# Patient Record
Sex: Female | Born: 1975 | Race: White | Hispanic: No | Marital: Single
Health system: Northeastern US, Academic
[De-identification: ages and names within clinical notes are randomized; demographics above are authoritative.]

## PROBLEM LIST (undated history)

## (undated) DIAGNOSIS — R87619 Unspecified abnormal cytological findings in specimens from cervix uteri: Secondary | ICD-10-CM

## (undated) DIAGNOSIS — IMO0002 Reserved for concepts with insufficient information to code with codable children: Secondary | ICD-10-CM

## (undated) DIAGNOSIS — L930 Discoid lupus erythematosus: Secondary | ICD-10-CM

## (undated) DIAGNOSIS — F419 Anxiety disorder, unspecified: Secondary | ICD-10-CM

## (undated) HISTORY — DX: Discoid lupus erythematosus: L93.0

## (undated) HISTORY — PX: CHOLECYSTECTOMY: SHX55

## (undated) HISTORY — DX: Anxiety disorder, unspecified: F41.9

## (undated) HISTORY — PX: GYNECOLOGIC CRYOSURGERY: SHX857

## (undated) HISTORY — DX: Unspecified abnormal cytological findings in specimens from cervix uteri: R87.619

## (undated) HISTORY — DX: Reserved for concepts with insufficient information to code with codable children: IMO0002

## (undated) MED FILL — ZEPBOUND 10 MG/0.5 ML SUBCUTANEOUS SOLUTION: 10 10 mg/0.5 mL | SUBCUTANEOUS | 28 days supply | Qty: 2 | Fill #0 | Status: CP

---

## 1997-11-11 ENCOUNTER — Other Ambulatory Visit: Admission: RE | Admit: 1997-11-11 | Discharge: 1997-11-11 | Payer: Self-pay | Admitting: Obstetrics & Gynecology

## 1997-12-17 ENCOUNTER — Emergency Department (HOSPITAL_COMMUNITY): Admission: EM | Admit: 1997-12-17 | Discharge: 1997-12-17 | Payer: Self-pay | Admitting: Emergency Medicine

## 1999-02-08 ENCOUNTER — Other Ambulatory Visit: Admission: RE | Admit: 1999-02-08 | Discharge: 1999-02-08 | Payer: Self-pay | Admitting: Obstetrics & Gynecology

## 2000-02-29 ENCOUNTER — Other Ambulatory Visit: Admission: RE | Admit: 2000-02-29 | Discharge: 2000-02-29 | Payer: Self-pay | Admitting: Obstetrics & Gynecology

## 2001-05-23 ENCOUNTER — Other Ambulatory Visit: Admission: RE | Admit: 2001-05-23 | Discharge: 2001-05-23 | Payer: Self-pay | Admitting: Obstetrics & Gynecology

## 2002-09-24 ENCOUNTER — Other Ambulatory Visit: Admission: RE | Admit: 2002-09-24 | Discharge: 2002-09-24 | Payer: Self-pay | Admitting: Obstetrics & Gynecology

## 2003-11-05 ENCOUNTER — Other Ambulatory Visit: Admission: RE | Admit: 2003-11-05 | Discharge: 2003-11-05 | Payer: Self-pay | Admitting: Obstetrics & Gynecology

## 2004-11-15 ENCOUNTER — Other Ambulatory Visit: Admission: RE | Admit: 2004-11-15 | Discharge: 2004-11-15 | Payer: Self-pay | Admitting: Obstetrics & Gynecology

## 2004-11-16 ENCOUNTER — Other Ambulatory Visit: Admission: RE | Admit: 2004-11-16 | Discharge: 2004-11-16 | Payer: Self-pay | Admitting: Obstetrics & Gynecology

## 2005-09-05 ENCOUNTER — Encounter: Admission: RE | Admit: 2005-09-05 | Discharge: 2005-09-05 | Payer: Self-pay | Admitting: Gastroenterology

## 2007-05-22 ENCOUNTER — Emergency Department (HOSPITAL_COMMUNITY): Admission: EM | Admit: 2007-05-22 | Discharge: 2007-05-22 | Payer: Self-pay | Admitting: Emergency Medicine

## 2009-12-17 ENCOUNTER — Encounter: Admission: RE | Admit: 2009-12-17 | Discharge: 2009-12-17 | Payer: Self-pay | Admitting: Obstetrics & Gynecology

## 2010-02-21 NOTE — L&D Delivery Note (Signed)
Patient was C/C/+1 and pushed for 100 minutes with epidural.   NSVD  female infant, Apgars 8/9, weight 7#2.   The patient had a right labia minus and 1st degree perineal lacerations repaired with 3-0 vicryl. Fundus was firm. EBL was expected. Placenta was delivered intact. Vagina was clear.  Baby was vigorous to bedside.  Philip Aspen

## 2010-06-16 LAB — ABO/RH: RH Type: POSITIVE

## 2010-06-16 LAB — GC/CHLAMYDIA PROBE AMP, GENITAL
Chlamydia: NEGATIVE
Gonorrhea: NEGATIVE

## 2010-06-16 LAB — HIV ANTIBODY (ROUTINE TESTING W REFLEX): HIV: NONREACTIVE

## 2010-11-15 LAB — POCT CARDIAC MARKERS: Operator id: 294521

## 2010-11-15 LAB — POCT I-STAT, CHEM 8
BUN: 12
Chloride: 106
Creatinine, Ser: 1
Potassium: 3.7
Sodium: 138
TCO2: 23

## 2010-11-15 LAB — CBC
Hemoglobin: 12.9
MCHC: 34.6
MCV: 96.6
RDW: 12.2

## 2011-01-26 ENCOUNTER — Inpatient Hospital Stay (HOSPITAL_COMMUNITY): Admission: AD | Admit: 2011-01-26 | Payer: Self-pay | Source: Ambulatory Visit | Admitting: Obstetrics & Gynecology

## 2011-01-31 ENCOUNTER — Encounter (HOSPITAL_COMMUNITY): Payer: Self-pay | Admitting: *Deleted

## 2011-01-31 ENCOUNTER — Telehealth (HOSPITAL_COMMUNITY): Payer: Self-pay | Admitting: *Deleted

## 2011-01-31 NOTE — Telephone Encounter (Signed)
Preadmission screen  

## 2011-02-04 ENCOUNTER — Encounter (HOSPITAL_COMMUNITY): Payer: Self-pay | Admitting: Anesthesiology

## 2011-02-04 ENCOUNTER — Inpatient Hospital Stay (HOSPITAL_COMMUNITY)
Admission: RE | Admit: 2011-02-04 | Discharge: 2011-02-06 | DRG: 373 | Disposition: A | Discharge status: Home / Self Care | Payer: BC Managed Care – PPO | Source: Ambulatory Visit | Attending: Obstetrics & Gynecology | Admitting: Obstetrics & Gynecology

## 2011-02-04 ENCOUNTER — Encounter (HOSPITAL_COMMUNITY): Payer: Self-pay

## 2011-02-04 ENCOUNTER — Inpatient Hospital Stay (HOSPITAL_COMMUNITY): Payer: BC Managed Care – PPO | Admitting: Anesthesiology

## 2011-02-04 DIAGNOSIS — O480 Post-term pregnancy: Principal | ICD-10-CM | POA: Diagnosis present

## 2011-02-04 LAB — CBC
HCT: 33.1 % — ABNORMAL LOW (ref 36.0–46.0)
MCH: 31.1 pg (ref 26.0–34.0)
MCHC: 33.2 g/dL (ref 30.0–36.0)
MCV: 93.5 fL (ref 78.0–100.0)
Platelets: 232 10*3/uL (ref 150–400)
RDW: 13.6 % (ref 11.5–15.5)

## 2011-02-04 MED ORDER — PHENYLEPHRINE 40 MCG/ML (10ML) SYRINGE FOR IV PUSH (FOR BLOOD PRESSURE SUPPORT)
80.0000 ug | PREFILLED_SYRINGE | INTRAVENOUS | Status: DC | PRN
  Filled 2011-02-04: qty 5

## 2011-02-04 MED ORDER — ACETAMINOPHEN 325 MG PO TABS: 650.0000 mg | ORAL_TABLET | ORAL | Status: DC | PRN

## 2011-02-04 MED ORDER — LACTATED RINGERS IV SOLN
INTRAVENOUS | Status: DC
  Administered 2011-02-04 (×2): via INTRAVENOUS

## 2011-02-04 MED ORDER — ONDANSETRON HCL 4 MG/2ML IJ SOLN: 4.0000 mg | Freq: Four times a day (QID) | INTRAMUSCULAR | Status: DC | PRN

## 2011-02-04 MED ORDER — IBUPROFEN 600 MG PO TABS
600.0000 mg | ORAL_TABLET | Freq: Four times a day (QID) | ORAL | Status: DC | PRN
  Administered 2011-02-05: 600 mg via ORAL
  Filled 2011-02-04: qty 1

## 2011-02-04 MED ORDER — FENTANYL 2.5 MCG/ML BUPIVACAINE 1/10 % EPIDURAL INFUSION (WH - ANES)
INTRAMUSCULAR | Status: DC | PRN
  Administered 2011-02-04: 14 mL/h via EPIDURAL

## 2011-02-04 MED ORDER — LACTATED RINGERS IV SOLN
500.0000 mL | INTRAVENOUS | Status: DC | PRN
  Administered 2011-02-04: 500 mL via INTRAVENOUS

## 2011-02-04 MED ORDER — LIDOCAINE HCL (PF) 1 % IJ SOLN
30.0000 mL | INTRAMUSCULAR | Status: DC | PRN
  Filled 2011-02-04: qty 30

## 2011-02-04 MED ORDER — PHENYLEPHRINE 40 MCG/ML (10ML) SYRINGE FOR IV PUSH (FOR BLOOD PRESSURE SUPPORT): 80.0000 ug | PREFILLED_SYRINGE | INTRAVENOUS | Status: DC | PRN

## 2011-02-04 MED ORDER — TERBUTALINE SULFATE 1 MG/ML IJ SOLN: 0.2500 mg | Freq: Once | INTRAMUSCULAR | Status: AC | PRN

## 2011-02-04 MED ORDER — LIDOCAINE HCL 1.5 % IJ SOLN
INTRAMUSCULAR | Status: DC | PRN
  Administered 2011-02-04 (×2): 4 mL via EPIDURAL

## 2011-02-04 MED ORDER — FLEET ENEMA 7-19 GM/118ML RE ENEM: 1.0000 | ENEMA | RECTAL | Status: DC | PRN

## 2011-02-04 MED ORDER — LACTATED RINGERS IV SOLN: 500.0000 mL | Freq: Once | INTRAVENOUS | Status: DC

## 2011-02-04 MED ORDER — CITRIC ACID-SODIUM CITRATE 334-500 MG/5ML PO SOLN: 30.0000 mL | ORAL | Status: DC | PRN

## 2011-02-04 MED ORDER — EPHEDRINE 5 MG/ML INJ: 10.0000 mg | INTRAVENOUS | Status: DC | PRN

## 2011-02-04 MED ORDER — OXYCODONE-ACETAMINOPHEN 5-325 MG PO TABS: 2.0000 | ORAL_TABLET | ORAL | Status: DC | PRN

## 2011-02-04 MED ORDER — DIPHENHYDRAMINE HCL 50 MG/ML IJ SOLN
12.5000 mg | INTRAMUSCULAR | Status: DC | PRN
Start: 2011-02-04 — End: 2011-02-05

## 2011-02-04 MED ORDER — OXYTOCIN 20 UNITS IN LACTATED RINGERS INFUSION - SIMPLE
125.0000 mL/h | Freq: Once | INTRAVENOUS | Status: AC
  Administered 2011-02-04: 125 mL/h via INTRAVENOUS
  Filled 2011-02-04: qty 1000

## 2011-02-04 MED ORDER — BUTORPHANOL TARTRATE 2 MG/ML IJ SOLN: 1.0000 mg | INTRAMUSCULAR | Status: DC | PRN

## 2011-02-04 MED ORDER — EPHEDRINE 5 MG/ML INJ
10.0000 mg | INTRAVENOUS | Status: DC | PRN
  Filled 2011-02-04: qty 4

## 2011-02-04 MED ORDER — OXYTOCIN BOLUS FROM INFUSION
500.0000 mL | Freq: Once | INTRAVENOUS | Status: DC
  Filled 2011-02-04: qty 500
  Filled 2011-02-04: qty 1000

## 2011-02-04 MED ORDER — FENTANYL 2.5 MCG/ML BUPIVACAINE 1/10 % EPIDURAL INFUSION (WH - ANES)
14.0000 mL/h | INTRAMUSCULAR | Status: DC
  Administered 2011-02-04 (×2): 14 mL/h via EPIDURAL
  Filled 2011-02-04 (×3): qty 60

## 2011-02-04 MED ORDER — OXYTOCIN 20 UNITS IN LACTATED RINGERS INFUSION - SIMPLE
1.0000 m[IU]/min | INTRAVENOUS | Status: DC
  Administered 2011-02-04: 2 m[IU]/min via INTRAVENOUS

## 2011-02-04 NOTE — Progress Notes (Signed)
Pt comfortable s/p epidural FHT 135 mod var, recent change of monitor, prior to change +A TOCO q2 SVE5/90/-1 AROM clear with blood tinged mucus Plan: cont current mgmt.

## 2011-02-04 NOTE — Plan of Care (Signed)
Problem: Consults Goal: Birthing Suites Patient Information Press F2 to bring up selections list  Outcome: Completed/Met Date Met:  02/04/11  Pt > [redacted] weeks EGA and Inpatient induction

## 2011-02-04 NOTE — H&P (Signed)
35 y.o. @41 .2  G2P0010 comes in for post dates induction.  Otherwise has good fetal movement and no bleeding.  Was experiencing some mild ctx on presenation.  Past Medical History  Diagnosis Date  . Abnormal Pap smear     Past Surgical History  Procedure Date  . Cholecystectomy   . Gynecologic cryosurgery     OB History    Grav Para Term Preterm Abortions TAB SAB Ect Mult Living   2 0 0 0 1 0 1 0 0 0      # Outc Date GA Lbr Len/2nd Wgt Sex Del Anes PTL Lv   1 SAB 2012           2 CUR               History   Social History  . Marital Status: Married    Spouse Name: N/A    Number of Children: N/A  . Years of Education: N/A   Occupational History  . Not on file.   Social History Main Topics  . Smoking status: Former Smoker -- 1.0 packs/day for 15 years    Types: Cigarettes    Quit date: 07/01/2010  . Smokeless tobacco: Never Used  . Alcohol Use: No  . Drug Use: No  . Sexually Active: Yes   Other Topics Concern  . Not on file   Social History Narrative  . No narrative on file   Codeine   Prenatal Course:  Signigicant pedal edema, fam h/o CHD, h/o cryo, prior smoker  Filed Vitals:   02/04/11 0932  BP: 147/80  Pulse: 69  Temp:   Resp: 18     Lungs/Cor:  NAD Abdomen:  soft, gravid Ex:  no cords, erythema SVE:  1/90/-2 per RN FHTs:  130, good STV, NST R Toco:  q 2-3   A/P   41.2 for IOL  GBSNeg Ctoco/efm Pitocin 2x2 Clear liquids Routine care  [place], Luther Parody

## 2011-02-04 NOTE — Anesthesia Preprocedure Evaluation (Signed)
Anesthesia Evaluation  Patient identified by MRN, date of birth, ID band Patient awake    Reviewed: Allergy & Precautions, H&P , Patient's Chart, lab work & pertinent test results  Airway Mallampati: III TM Distance: >3 FB Neck ROM: full    Dental No notable dental hx. (+) Teeth Intact   Pulmonary neg pulmonary ROS,  clear to auscultation  Pulmonary exam normal       Cardiovascular neg cardio ROS regular Normal    Neuro/Psych Negative Neurological ROS  Negative Psych ROS   GI/Hepatic negative GI ROS, Neg liver ROS,   Endo/Other  Negative Endocrine ROS  Renal/GU negative Renal ROS  Genitourinary negative   Musculoskeletal   Abdominal Normal abdominal exam  (+)   Peds  Hematology negative hematology ROS (+)   Anesthesia Other Findings   Reproductive/Obstetrics (+) Pregnancy                           Anesthesia Physical Anesthesia Plan  ASA: II  Anesthesia Plan: Epidural   Post-op Pain Management:    Induction:   Airway Management Planned:   Additional Equipment:   Intra-op Plan:   Post-operative Plan:   Informed Consent: I have reviewed the patients History and Physical, chart, labs and discussed the procedure including the risks, benefits and alternatives for the proposed anesthesia with the patient or authorized representative who has indicated his/her understanding and acceptance.     Plan Discussed with: Anesthesiologist and Surgeon  Anesthesia Plan Comments:         Anesthesia Quick Evaluation  

## 2011-02-04 NOTE — Anesthesia Procedure Notes (Signed)
Epidural Patient location during procedure: OB Start time: 02/04/2011 2:24 PM  Staffing Anesthesiologist: Josejulian Tarango A. Performed by: anesthesiologist   Preanesthetic Checklist Completed: patient identified, site marked, surgical consent, pre-op evaluation, timeout performed, IV checked, risks and benefits discussed and monitors and equipment checked  Epidural Patient position: sitting Prep: site prepped and draped and DuraPrep Patient monitoring: continuous pulse ox and blood pressure Approach: midline Injection technique: LOR air  Needle:  Needle type: Tuohy  Needle gauge: 17 G Needle length: 9 cm Needle insertion depth: 4 cm Catheter type: closed end flexible Catheter size: 19 Gauge Catheter at skin depth: 9 cm Test dose: negative and 1.5% lidocaine  Assessment Events: blood not aspirated, injection not painful, no injection resistance, negative IV test and no paresthesia  Additional Notes Patient is more comfortable after epidural dosed. Please see RN's note for documentation of vital signs and FHR which are stable.

## 2011-02-04 NOTE — Progress Notes (Signed)
Foley catheter removed at 1755 02/04/11 due to foley catheter not draining properly, no urine draining

## 2011-02-05 ENCOUNTER — Encounter (HOSPITAL_COMMUNITY): Payer: Self-pay

## 2011-02-05 LAB — CBC
HCT: 31.8 % — ABNORMAL LOW (ref 36.0–46.0)
Hemoglobin: 10.4 g/dL — ABNORMAL LOW (ref 12.0–15.0)
MCV: 93.3 fL (ref 78.0–100.0)
RBC: 3.41 MIL/uL — ABNORMAL LOW (ref 3.87–5.11)
WBC: 15 10*3/uL — ABNORMAL HIGH (ref 4.0–10.5)

## 2011-02-05 MED ORDER — DIPHENHYDRAMINE HCL 25 MG PO CAPS: 25.0000 mg | ORAL_CAPSULE | Freq: Four times a day (QID) | ORAL | Status: DC | PRN

## 2011-02-05 MED ORDER — ZOLPIDEM TARTRATE 5 MG PO TABS: 5.0000 mg | ORAL_TABLET | Freq: Every evening | ORAL | Status: DC | PRN

## 2011-02-05 MED ORDER — OXYCODONE-ACETAMINOPHEN 5-325 MG PO TABS
1.0000 | ORAL_TABLET | ORAL | Status: DC | PRN
  Administered 2011-02-05 – 2011-02-06 (×2): 1 via ORAL
  Filled 2011-02-05 (×2): qty 1

## 2011-02-05 MED ORDER — PRENATAL PLUS 27-1 MG PO TABS
1.0000 | ORAL_TABLET | Freq: Every day | ORAL | Status: DC
  Administered 2011-02-05 – 2011-02-06 (×2): 1 via ORAL
  Filled 2011-02-05 (×2): qty 1

## 2011-02-05 MED ORDER — IBUPROFEN 600 MG PO TABS
600.0000 mg | ORAL_TABLET | Freq: Four times a day (QID) | ORAL | Status: DC
  Administered 2011-02-05 – 2011-02-06 (×6): 600 mg via ORAL
  Filled 2011-02-05 (×6): qty 1

## 2011-02-05 MED ORDER — ACETAMINOPHEN 325 MG PO TABS: 650.0000 mg | ORAL_TABLET | Freq: Four times a day (QID) | ORAL | Status: DC | PRN

## 2011-02-05 MED ORDER — CALCIUM CARBONATE ANTACID 500 MG PO CHEW
1.0000 | CHEWABLE_TABLET | ORAL | Status: DC | PRN
  Administered 2011-02-05: 200 mg via ORAL
  Filled 2011-02-05: qty 1

## 2011-02-05 MED ORDER — SENNOSIDES-DOCUSATE SODIUM 8.6-50 MG PO TABS
2.0000 | ORAL_TABLET | Freq: Every day | ORAL | Status: DC
  Administered 2011-02-05: 2 via ORAL

## 2011-02-05 MED ORDER — HYDROCORTISONE ACE-PRAMOXINE 1-1 % RE FOAM
1.0000 | Freq: Two times a day (BID) | RECTAL | Status: DC
  Administered 2011-02-05 (×2): 1 via RECTAL
  Filled 2011-02-05 (×2): qty 10

## 2011-02-05 MED ORDER — LANOLIN HYDROUS EX OINT: TOPICAL_OINTMENT | CUTANEOUS | Status: DC | PRN

## 2011-02-05 MED ORDER — SIMETHICONE 80 MG PO CHEW: 80.0000 mg | CHEWABLE_TABLET | ORAL | Status: DC | PRN

## 2011-02-05 MED ORDER — ONDANSETRON HCL 4 MG/2ML IJ SOLN: 4.0000 mg | INTRAMUSCULAR | Status: DC | PRN

## 2011-02-05 MED ORDER — WITCH HAZEL-GLYCERIN EX PADS
1.0000 "application " | MEDICATED_PAD | CUTANEOUS | Status: DC | PRN
  Administered 2011-02-05: 1 via TOPICAL

## 2011-02-05 MED ORDER — PANTOPRAZOLE SODIUM 40 MG PO TBEC
40.0000 mg | DELAYED_RELEASE_TABLET | Freq: Every day | ORAL | Status: DC
  Filled 2011-02-05 (×3): qty 1

## 2011-02-05 MED ORDER — DIBUCAINE 1 % RE OINT: 1.0000 "application " | TOPICAL_OINTMENT | RECTAL | Status: DC | PRN

## 2011-02-05 MED ORDER — BENZOCAINE-MENTHOL 20-0.5 % EX AERO
1.0000 "application " | INHALATION_SPRAY | CUTANEOUS | Status: DC | PRN
  Administered 2011-02-05: 1 via TOPICAL

## 2011-02-05 MED ORDER — ONDANSETRON HCL 4 MG PO TABS: 4.0000 mg | ORAL_TABLET | ORAL | Status: DC | PRN

## 2011-02-05 MED ORDER — TETANUS-DIPHTH-ACELL PERTUSSIS 5-2.5-18.5 LF-MCG/0.5 IM SUSP: 0.5000 mL | Freq: Once | INTRAMUSCULAR | Status: DC

## 2011-02-05 MED ORDER — BENZOCAINE-MENTHOL 20-0.5 % EX AERO
INHALATION_SPRAY | CUTANEOUS | Status: AC
  Filled 2011-02-05: qty 56

## 2011-02-05 MED ORDER — LACTATED RINGERS IV SOLN: INTRAVENOUS | Status: DC

## 2011-02-05 NOTE — Anesthesia Postprocedure Evaluation (Signed)
Anesthesia Post Note  Patient: Carol Pope  Procedure(s) Performed: * No procedures listed *  Anesthesia type: Epidural  Patient location: Mother/Baby  Post pain: Pain level controlled  Post assessment: Post-op Vital signs reviewed  Last Vitals:  Filed Vitals:   02/05/11 0750  BP: 159/75  Pulse: 65  Temp: 36.7 C  Resp: 18    Post vital signs: Reviewed  Level of consciousness: awake  Complications: No apparent anesthesia complications

## 2011-02-05 NOTE — Progress Notes (Addendum)
Patient is eating, ambulating, voiding.  Pain control is good. Lochia minimal. No other complaints.  Filed Vitals:   02/05/11 0132 02/05/11 0200 02/05/11 0233 02/05/11 0330  BP: 157/87 141/82 144/91 138/69  Pulse: 87 84 81 78  Temp:  98.3 F (36.8 C) 98.7 F (37.1 C) 97.6 F (36.4 C)  TempSrc:  Oral Oral Oral  Resp: 20 20 16 16   Height:      Weight:      SpO2:   96% 96%    Fundus firm No CT  Lab Results  Component Value Date   WBC 15.0* 02/05/2011   HGB 10.4* 02/05/2011   HCT 31.8* 02/05/2011   MCV 93.3 02/05/2011   PLT 227 02/05/2011    A/Positive/-- (04/25 0000)  A/P Post partum day 1. BPs mild range since delivery, will continue to monitor.  Routine care.  Expect d/c tomorrow.   Philip Aspen

## 2011-02-06 NOTE — Discharge Summary (Signed)
Obstetric Discharge Summary Reason for Admission: induction of labor Prenatal Procedures: none Intrapartum Procedures: spontaneous vaginal delivery Postpartum Procedures: none Complications-Operative and Postpartum: none Hemoglobin  Date Value Range Status  02/05/2011 10.4* 12.0-15.0 (g/dL) Final     HCT  Date Value Range Status  02/05/2011 31.8* 36.0-46.0 (%) Final    Discharge Diagnoses: Term Pregnancy-delivered  Discharge Information: Date: 02/06/2011 Activity: unrestricted Diet: routine Medications: PNV, Ibuprofen and Colace Condition: stable Instructions: refer to practice specific booklet Discharge to: home Follow-up Information    Follow up with Ivey Nembhard, DO in 4 weeks. (call office for blood pressure check appt this week.)    Contact information:   [address], [address] [facility] [place] [address] [phone]          Newborn Data: Live born female  Birth Weight: 7 lb 2.3 oz (3240 g) APGAR: 8, 9  Home with mother.  Carol Pope 02/06/2011, 9:14 AM

## 2011-02-07 NOTE — Progress Notes (Signed)
UR chart review completed.  

## 2011-02-21 ENCOUNTER — Inpatient Hospital Stay (HOSPITAL_COMMUNITY): Admission: AD | Admit: 2011-02-21 | Payer: Self-pay | Source: Ambulatory Visit | Admitting: Obstetrics & Gynecology

## 2012-03-12 ENCOUNTER — Encounter (INDEPENDENT_AMBULATORY_CARE_PROVIDER_SITE_OTHER): Payer: Self-pay

## 2012-03-12 ENCOUNTER — Ambulatory Visit (INDEPENDENT_AMBULATORY_CARE_PROVIDER_SITE_OTHER): Payer: BC Managed Care – PPO | Admitting: General Surgery

## 2012-03-12 ENCOUNTER — Encounter (INDEPENDENT_AMBULATORY_CARE_PROVIDER_SITE_OTHER): Payer: Self-pay | Admitting: General Surgery

## 2012-03-12 VITALS — BP 138/78 | HR 78 | Temp 98.7°F | Resp 18 | Ht 67.0 in | Wt 134.0 lb

## 2012-03-12 DIAGNOSIS — K645 Perianal venous thrombosis: Secondary | ICD-10-CM

## 2012-03-12 MED ORDER — HYDROCODONE-ACETAMINOPHEN 10-325 MG PO TABS: 1.0000 | ORAL_TABLET | Freq: Four times a day (QID) | ORAL | Status: AC | PRN

## 2012-03-12 NOTE — Progress Notes (Signed)
The patient comes in with a thrombosed external hemorrhoid at approximately the 9:00 position with 12:00 being directly posterior.   With a Betadine prep and local anesthetic I was able to excise the external hemorrhoid with clot. The wound was left open. The patient had minimal discomfort. I will give her some Vicodin tablets to take home. She is return to see me in 2-3 weeks.  She's been advised to do sitz baths as often as she can possibly tolerate it at this time. No antibiotics are necessary.

## 2012-04-03 ENCOUNTER — Encounter (INDEPENDENT_AMBULATORY_CARE_PROVIDER_SITE_OTHER): Payer: BC Managed Care – PPO | Admitting: General Surgery

## 2012-04-07 ENCOUNTER — Other Ambulatory Visit: Payer: Self-pay

## 2012-04-24 ENCOUNTER — Encounter (INDEPENDENT_AMBULATORY_CARE_PROVIDER_SITE_OTHER): Payer: BC Managed Care – PPO | Admitting: General Surgery

## 2012-12-27 ENCOUNTER — Other Ambulatory Visit: Payer: Self-pay

## 2013-12-23 ENCOUNTER — Encounter (INDEPENDENT_AMBULATORY_CARE_PROVIDER_SITE_OTHER): Payer: Self-pay | Admitting: General Surgery

## 2016-06-16 ENCOUNTER — Ambulatory Visit

## 2016-12-05 ENCOUNTER — Ambulatory Visit: Admitting: Family Medicine

## 2016-12-05 NOTE — Progress Notes (Addendum)
---    **Progress Notes**    ---    **Patient:** Sierra Nichols   **Provider:** Setsuko Robins R. Christella Scheuermann, M.D.     **DOB:** 12-18-1975 **Age:** 40 Y **Sex:** Female   **Date:** 12/05/2016             * * *        ---         **Reason for Appointment**    ---       1\. Physical, adult; np    ---       **History of Present Illness**    ---     _General_ :    old pcp was from [place].       **Current Medications**    ---    Taking       * Mirena         ---          * Medication List reviewed and reconciled with the patient        ---       **Past Medical History**    ---       Cholecystitis    ---    Contraception, placed Feb 2018( 2nd)    ---    Error of refraction    ---    BMI> 40    ---    Mammogram at 38 was normal    ---    Family hx of Maternal GM with breast cancer postmenopausal(double mastectomy)    ---    Last pap was 03/2016, was normal, [facility]    ---    Lipoma, right posterior chest    ---       **Surgical History**    ---       Laparoscopic surgery, for GB 07/2016    ---    Colposcopy for HPV positive    ---      **Family History**    ---       Father: deceased 12 yrs, Brain aneurysm, died in 61    ---    Mother: alive 44 yrs, On medication, diagnosed with Hypertension    ---    Sister: alive 3 yrs, healthy    ---    Paternal Grand Father: deceased 57 yrs, possibly heart attack,    ---    Paternal Grand Mother: deceased 59 yrs, 75, died in her sleep    ---    Maternal Grand Father: deceased, COPD, congestive heart failure, 40 yrs old.    ---    Maternal Grand Mother: deceased, breast cancer, in her late 61's  postmenopausal, diagnosed with Cancer    ---    1 sister(s) - healthy. 2daughter(s) - healthy.    ---    2 daughters are healthy 8 and 71.    ---       **Social History**    ---    Diet: no specific.    Marijuana: no.    Seat belts: always.    Sun Screen: uses daily.    Guns in home: none.    Feels safe in home: yes.    Domestic violence: none.    Mammogram: current.    Alcohol alcohol  occasional 2x a week, beer or wine.    Children: daughter(s) 2.    Education: BS/BA in [place], for 1 yr.    Exercise: walking.    Living with: with boyfriend( not father of the kids), mom and kids.  Marital Status: single.    Occupation: Print production planner.    Pets: dog.    Sexually active: significant other only.    Tobacco Use Status: never smoker, Patient counseled on cessation: 12/05/2016.      **OB History**    ---    Total living children 2\.    Pregnancy # 1: NSVD.    Pregnancy # 2: NSVD.      **Allergies**    ---       N.K.D.A.    ---       **Hospitalization/Major Diagnostic Procedure**    ---       as above    ---      **Vital Signs**    ---    Temp 98.5, BP 107/62, HR 109, Ht 66, Wt 308, BMI 49.71, RR 18.       **Examination**    ---     _General Examination_ :    GENERAL APPEARANCE: well developed and well nourished, fairly developed,  moderately overweight. HEENT: Head - NC/AT, EOMI, PERRLA, nose clear,  turbinates normal, pharynx and tonsils normal. OROPHARYNX: normal dentition.  NECK: supple, no lymphadenopathy, no carotid bruits noted, no thyromegaly, no  jugular distension, no lymphadenopathy.Marland Kitchen LYMPH NODES: No palpable adenopathy.  HEART: RSR/RRR, normal S1S2, no murmurs, PMI at 5th intercostal space, left  midclavicular line. LUNGS: good air entry bilaterally, clear to auscultation  and percussion, equal breath sounds bilaterally, no rales, wheeze or rhonchi.  ABDOMEN: normoactive BS, soft, NT/ND, BS present, no masses palpated, no  hepatosplenomegaly, no costovertebral tenderness.Marland Kitchen EXTREMITIES: sensations  normal, symmetric strength and reflexes-no clubbing, no edema, no cyanosis.  NEUROLOGIC EXAM: no focal signs. SKIN: normal, no rash. PERIPHERAL PULSES:  normal (2+) bilaterally, x 4 extremities .          **Assessments**    ---    1\. Encounter for general adult medical examination without abnormal findings  - Z00.00 (Primary)    ---    2\. Encounter for immunization - Z23, tdap, flu shot    ---     3\. Body mass index (BMI) of 40.1-44.9 in adult - Z68.41    ---    4\. Family history of breast cancer - Z80.3    ---       **Treatment**    ---       **1\. Encounter for general adult medical examination without abnormal  findings**    _LAB: CBC/DIFF - CBCD_    _LAB: COMP METABOLIC PANEL RANDOM - CMP_    _LAB: Lipid Panel_    _LAB: Urinalysis (Complete)_    _Imaging: [facility] Digital Mammogram Screening Bilat e-sch*_    Notes: healthy lifestyle habits including consistent exercise, moderation in  alcohol if any, wearing seatbelts and sunblock advised, Tdap updated, annual  flu shot recommended in the fall and received today.    ---        **2\. Body mass index (BMI) of 40.1-44.9 in adult**    _LAB: 3 Gen Sensitive TSH_    _LAB: VITAMIN-D 25-HYDROXY - VITD25_    Notes: Considering surgical weight loss>>referral to [facility] wellness clinic,  emphasized to pt that the most enduring weight loss regimen is being  consistent with diet and program of exercises if any, but the surgical weight  loss program is definitely help make the jumpstart to start with more  accelerated weight loss. Pt agrees to comply with classess and regimen  recommended.        **3\. Family history of breast  cancer**    _Imaging: [facility] Digital Mammogram Screening Bilat e-sch*_    Notes: screening as above.      **Immunization**    ---       TDAP - OFFICE SUPPLIED : 0.5 given by Gerrit Halls LPN on Left Deltoid    ---    INFLUENZA OFFICE SUPPLIED 18+ : 0.5 mL given by Gerrit Halls LPN on Right  Deltoid    ---       **Procedure Codes**    ---       40347 TDAP - OFFICE SUPPLIED    ---    90471 Single or Combination    ---    42595 FLU VACC 4 VAL 3 YRS PLUS IM    ---       **Follow Up**    ---    6 Months, prn    Electronically signed by Carrolyn Meiers MD, MD on 12/09/2016 at 04:29 PM EDT    Sign off status: Completed          * * *      Patient: Sierra Nichols   Provider: Beckie Busing. Christella Scheuermann, M.D.    --- ---    DOB: 1975/10/16  Date: 12/05/2016    Note generated by  eClinicalWorks EMR/PM Software ([email or web address])    ---

## 2017-01-17 ENCOUNTER — Ambulatory Visit: Admitting: Family Medicine

## 2017-01-17 NOTE — Progress Notes (Addendum)
* * *        **  [facility], Cheri Guppy    --- ---    63 Y old Female, DOB: 01/25/1976    [address], [place], [place] [address]    Home: [phone]    Provider: Carrolyn Meiers, MD        * * *    Telephone Encounter    ---    Answered by   Carrolyn Meiers  Date: 01/17/2017         Time: 06:51 PM    Reason   old records fr [facility]    --- ---            Message                      Records show she only got one dose of Hep B.  Pls have her schedule for 2 more doses( at least 4 months apart). thanks.                Action Taken   Veatrice Bourbon [facility] 82/95/6213 8:52:50 AM > Left message for  patient to call and schedule appointment                * * *                ---          * * *          Patient: Sierra Nichols, Sierra Nichols DOB: 11/03/1975 Provider: Carrolyn Meiers, MD  01/17/2017    ---    Note generated by eClinicalWorks EMR/PM Software ([email or web address])

## 2017-01-20 ENCOUNTER — Ambulatory Visit: Admitting: Family Medicine

## 2017-01-20 LAB — HX LIPID PANEL
CASE NUMBER: 2018334002330
HX CHOL: 245 mg/dL — ABNORMAL HIGH
HX HDL: 46 mg/dL — NL
HX LDL: 169 mg/dL — ABNORMAL HIGH
HX TRIG: 150 mg/dL — NL

## 2017-01-20 LAB — HX COMPREHENSIVE METABOLIC PANEL
CASE NUMBER: 2018334002330
HX ALBUMIN LVL: 3.9 g/dL — NL (ref 3.2–5.0)
HX ALKALINE PHOSPHATASE: 84 U/L — NL (ref 30.0–117.0)
HX ALT: 32 U/L — NL (ref 6.0–55.0)
HX ANION GAP: 8 — NL (ref 3.0–11.0)
HX AST: 15 U/L — NL (ref 6.0–40.0)
HX BILIRUBIN TOTAL: 0.4 mg/dL — NL (ref 0.2–1.2)
HX BUN: 9 mg/dL — NL (ref 6.0–20.0)
HX CALCIUM LVL: 9.5 mg/dL — NL (ref 8.5–10.5)
HX CHLORIDE: 103 mmol/L — NL (ref 98.0–110.0)
HX CO2: 27 mmol/L — NL (ref 21.0–32.0)
HX CREATININE: 0.575 mg/dL — NL (ref 0.55–1.3)
HX GLUCOSE LVL: 108 mg/dL — NL (ref 70.0–110.0)
HX POTASSIUM LVL: 3.9 mmol/L — NL (ref 3.6–5.2)
HX SODIUM LVL: 138 mmol/L — NL (ref 136.0–146.0)
HX TOTAL PROTEIN: 7.5 g/dL — NL (ref 6.0–8.4)

## 2017-01-20 LAB — HX .AUTOMATED DIFF
CASE NUMBER: 2018334002330
HX ABSOLUTE BASO COUNT: 0.03 10*3/uL — NL (ref 0.0–0.22)
HX ABSOLUTE EOS COUNT: 0.1 10*3/uL — NL (ref 0.0–0.45)
HX ABSOLUTE LYMPHS COUNT: 3.43 10*3/uL — NL (ref 0.74–5.04)
HX ABSOLUTE MONO COUNT: 0.69 10*3/uL — NL (ref 0.0–1.34)
HX ABSOLUTE NEUTRO COUNT: 5.11 10*3/uL — NL (ref 1.48–7.95)
HX BASOPHILS: 0.3 %
HX EOSINOPHILS: 1.1 %
HX IMMATURE GRANULOCYTES: 0.3 % — NL (ref 0.0–2.0)
HX LYMPHOCYTES: 36.5 %
HX MONOCYTES: 7.3 %
HX NEUTROPHILS: 54.5 %

## 2017-01-20 LAB — HX URINALYSIS (COMPLETE)
CASE NUMBER: 2018334002424
HX UA BILIRUBIN: NEGATIVE — NL
HX UA BLOOD: NEGATIVE — NL
HX UA GLUCOSE: NEGATIVE — NL
HX UA KETONES: NEGATIVE — NL
HX UA LEUKOCYTE ESTERASE: NEGATIVE — NL
HX UA NITRITE: NEGATIVE — NL
HX UA PH: 6 — NL (ref 5.0–8.0)
HX UA PROTEIN: NEGATIVE — NL
HX UA RBC: <1 — NL (ref 0.0–2.0)
HX UA SPECIFIC GRAVITY: 1.004 — NL (ref 1.003–1.03)
HX UA SQUAMOUS EPITHELIAL: 1 /HPF — NL (ref 0.0–5.0)
HX UA UROBILINOGEN: NEGATIVE — NL
HX UA WBC: 1 /HPF — NL (ref 0.0–5.0)

## 2017-01-20 LAB — HX CBC W/ DIFF
CASE NUMBER: 2018334002330
HX ABSOLUTE NRBC COUNT: 0 10*3/uL
HX HCT: 42.1 % — NL (ref 36.0–47.0)
HX HGB: 13.6 g/dL — NL (ref 11.8–16.0)
HX MCH: 27.5 pg — NL (ref 26.0–34.0)
HX MCHC: 32.3 g/dL — NL (ref 31.0–37.0)
HX MCV: 85.1 fL — NL (ref 80.0–100.0)
HX MPV: 12.1 fL — NL (ref 9.4–12.4)
HX NRBC PERCENT: 0 % — NL
HX PLATELET: 244 10*3/uL — NL (ref 150.0–400.0)
HX RBC: 4.95 10*6/uL — NL (ref 3.9–5.2)
HX RDW-CV: 13.9 % — NL (ref 11.5–14.5)
HX RDW-SD: 42.9 fL — NL (ref 35.0–51.0)
HX WBC: 9.4 10*3/uL — NL (ref 3.7–11.2)

## 2017-01-20 LAB — HX VITAMIN D 25 HYDROXY LEVEL (RECOMMENDED)
CASE NUMBER: 2018334002330
HX VITAMIN D 25 OH LVL: 23 ng/mL — ABNORMAL LOW (ref 30.0–100.0)

## 2017-01-20 LAB — HX TSH
CASE NUMBER: 2018334002330
HX 3RD GEN TSH: 2.74 u[IU]/mL — NL (ref 0.358–3.74)

## 2017-01-20 LAB — HX GLOMERULAR FILTRATION RATE (ESTIMATED)
CASE NUMBER: 2018334002330
HX AFN AMER GLOMERULAR FILTRATION RATE: >90
HX NON-AFN AMER GLOMERULAR FILTRATION RATE: >90

## 2017-01-30 ENCOUNTER — Ambulatory Visit: Admitting: Family Medicine

## 2017-01-30 NOTE — Progress Notes (Addendum)
* * *        **  [facility]    --- ---    29 Y old Female, DOB: 06/05/1975    [address], [place], [place] [address]    Home: [phone]    Provider: Carrolyn Meiers, MD        * * *    Telephone Encounter    ---    Answered by   Carrolyn Meiers  Date: 01/30/2017         Time: 06:07 PM    Reason   labs    --- ---            Message                      Pls tell her that her labs are ok, except the  LDL cholesterol( bad cholesterol) is sl high and recommend healthy eating habits and exercise.            Vitamin D(23) is sl low, so I will send vitamin d supplement to the pharmacy.                 Action Taken   Veatrice Bourbon [facility] 82/95/6213 2:31:00 PM > Left message to  return call. Veatrice Bourbon [facility] 08/65/7846 2:39:11 PM > Pt aware            Refills  Start Cholecalciferol Capsule, 1000 UNIT, Orally, 30, 1 capsule, Once  a day, 30 day(s), Refills=5    --- ---          * * *                ---          * * *          PatientFarrel Gordon Nichols DOB: 06/05/1975 Provider: Carrolyn Meiers, MD  01/30/2017    ---    Note generated by eClinicalWorks EMR/PM Software ([email or web address])

## 2017-04-04 ENCOUNTER — Ambulatory Visit

## 2017-04-19 ENCOUNTER — Ambulatory Visit: Admitting: Surgery

## 2017-05-03 ENCOUNTER — Ambulatory Visit: Admitting: Surgery

## 2017-05-04 ENCOUNTER — Ambulatory Visit

## 2017-05-19 ENCOUNTER — Ambulatory Visit: Admitting: Surgery

## 2017-05-19 LAB — HX CBC W/ INDICES
CASE NUMBER: 2019088001165
HX ABSOLUTE NRBC COUNT: 0 10*3/uL
HX HCT: 42.3 % — NL (ref 36.0–47.0)
HX HGB: 13.8 g/dL — NL (ref 11.8–16.0)
HX MCH: 27 pg — NL (ref 26.0–34.0)
HX MCHC: 32.6 g/dL — NL (ref 31.0–37.0)
HX MCV: 82.8 fL — NL (ref 80.0–100.0)
HX MPV: 11.7 fL — NL (ref 9.4–12.4)
HX NRBC PERCENT: 0 % — NL
HX PLATELET: 260 10*3/uL — NL (ref 150.0–400.0)
HX RBC: 5.11 10*6/uL — NL (ref 3.9–5.2)
HX RDW-CV: 13.7 % — NL (ref 11.5–14.5)
HX RDW-SD: 41.4 fL — NL (ref 35.0–51.0)
HX WBC: 8.4 10*3/uL — NL (ref 3.7–11.2)

## 2017-05-19 LAB — HX COMPREHENSIVE METABOLIC PANEL
CASE NUMBER: 2019088001165
HX ALBUMIN LVL: 3.8 g/dL — NL (ref 3.2–5.0)
HX ALKALINE PHOSPHATASE: 83 U/L — NL (ref 30.0–117.0)
HX ALT: 29 U/L — NL (ref 6.0–55.0)
HX ANION GAP: 5 — NL (ref 3.0–11.0)
HX AST: 15 U/L — NL (ref 6.0–40.0)
HX BILIRUBIN TOTAL: 0.3 mg/dL — NL (ref 0.2–1.2)
HX BUN: 16 mg/dL — NL (ref 6.0–20.0)
HX CALCIUM LVL: 8.6 mg/dL — NL (ref 8.5–10.5)
HX CHLORIDE: 106 mmol/L — NL (ref 98.0–110.0)
HX CO2: 29 mmol/L — NL (ref 21.0–32.0)
HX CREATININE: 0.656 mg/dL — NL (ref 0.55–1.3)
HX GLUCOSE LVL: 94 mg/dL — NL (ref 70.0–110.0)
HX POTASSIUM LVL: 4.4 mmol/L — NL (ref 3.6–5.2)
HX SODIUM LVL: 140 mmol/L — NL (ref 136.0–146.0)
HX TOTAL PROTEIN: 7.5 g/dL — NL (ref 6.0–8.4)

## 2017-05-19 LAB — HX IRON PROFILE
CASE NUMBER: 2019088001165
HX % IRON BOUND: 26 % — NL (ref 10.0–50.0)
HX FERRITIN LVL: 192 ng/mL — NL (ref 11.0–307.0)
HX IRON: 72 ug/dL — NL (ref 30.0–160.0)
HX TIBC: 275 ug/dL — NL (ref 250.0–450.0)

## 2017-05-19 LAB — HX FOLATE LEVEL
CASE NUMBER: 2019088001165
HX FOLATE LVL: >20 — NL

## 2017-05-19 LAB — HX VITAMIN D 25 HYDROXY LEVEL (RECOMMENDED)
CASE NUMBER: 2019088001165
HX VITAMIN D 25 OH LVL: 24 ng/mL — ABNORMAL LOW (ref 30.0–100.0)

## 2017-05-19 LAB — HX GLOMERULAR FILTRATION RATE (ESTIMATED)
CASE NUMBER: 2019088001165
HX AFN AMER GLOMERULAR FILTRATION RATE: >90
HX NON-AFN AMER GLOMERULAR FILTRATION RATE: >90

## 2017-05-19 LAB — HX VITAMIN B12 LEVEL
CASE NUMBER: 2019088001165
HX VITAMIN B12 LVL: 711 pg/mL — NL (ref 193.0–986.0)

## 2017-05-26 LAB — HX VITAMIN B1 LEVEL
CASE NUMBER: 2019088001165
HX VITAMINE B1 LEVEL: 15 nmol/L

## 2017-05-30 ENCOUNTER — Other Ambulatory Visit: Payer: Self-pay | Admitting: Obstetrics & Gynecology

## 2017-05-30 DIAGNOSIS — R928 Other abnormal and inconclusive findings on diagnostic imaging of breast: Secondary | ICD-10-CM

## 2017-06-02 ENCOUNTER — Ambulatory Visit: Admitting: Surgery

## 2017-06-02 ENCOUNTER — Ambulatory Visit
Admission: RE | Admit: 2017-06-02 | Discharge: 2017-06-02 | Disposition: A | Discharge status: Home / Self Care | Payer: BLUE CROSS/BLUE SHIELD | Source: Ambulatory Visit | Attending: Obstetrics & Gynecology | Admitting: Obstetrics & Gynecology

## 2017-06-02 DIAGNOSIS — R928 Other abnormal and inconclusive findings on diagnostic imaging of breast: Secondary | ICD-10-CM

## 2017-06-20 ENCOUNTER — Ambulatory Visit: Admitting: Cardiovascular Disease

## 2017-06-20 ENCOUNTER — Ambulatory Visit (HOSPITAL_BASED_OUTPATIENT_CLINIC_OR_DEPARTMENT_OTHER)

## 2017-06-20 NOTE — Progress Notes (Signed)
* * *         [facility], [facility]**        ---    Lawernce Ion. Sindy Messing, MD [facility] Kieth Brightly. Donnetta Simpers, MD [facility] Barbera Setters Byrd Hesselbach, MD  [facility];    Darlyn Chamber, MD [facility] Roosvelt Maser, MD [facility] Shanon Brow, MD [facility]  Nile Dear. Audley Hose, MD [facility]    Kandis Mannan A. Karie Mainland, MD [facility] Johnell Comings. MacNaught, MD [facility] Mitchel Honour, MD Erma Pinto, MD [facility]    Colletta Maryland, MD Weston Anna, MD [facility] Kerby Moors, NP Maximino Greenland ,  NP    Tonna Corner, NP Danelle Earthly, NP        * * *     **Patient Name:** Sierra Nichols [facility]   **Date:** 06/20/2017    --- ---     **DOB:** 12/03/1975     **Referring Provider:** Milinda Hirschfeld, MD   **Appointment Provider:**  [name], MD        * * *    06/20/2017  Progress Notes: [name], MD    --- ---    ---        Current Medications    ---    Taking     * B-12     ---    * multivitamin     ---    * calcium citrate     ---    * Medication List reviewed and reconciled with the patient    ---      Past Medical History    ---      None    ---      Family History    ---      Father: deceased    ---    Mother: alive    ---    Father : ICH - died 50s\n\n(-) premature CAD    (-) DM HTN.    ---      Social History    ---    no Tobacco .    no Alcohol.   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    ---      Allergies    ---      N.K.D.A.    ---        Reason for Appointment    ---      1\. Weight mgmt calling to schedule, pre-op gastric bypass;re-entered did  not cross over TO [facility]    ---      History of Present Illness    ---     _History of Present Illness_ :    sleeve shen    (-) OSA    sister bariatirc surgery next monday    changed diet 10 lb    Not mucha avtovty    chinese food swelling    no pregnancy issue    (-) HTN.      Vital Signs    ---    HR 68, BP 110/62, Wt 300, Ht 5'5'', BMI 49.92, Med Assist: em.      Data    ---     _EKG (reviewed personally)_ :    NSR borderline 1 rst degree AVB.    _Reveals_ :    General appearance well developed,  well nourished. HEENT Normocephalic,  atraumatic. Neck exam No jugular venous distension or hepatojugular reflux,  Carotid upstrokes normal without bruits. Chest symetrical  to expansion without  subclavian bruits. Lungs clear to auscultation without rales or wheezing.  Cardiac exam Nondisplaced PMI, regular heart sounds without S3, S4, No  murmurs, rubs, thrills or heaves. Abdominal exam Soft, nontender, nondistended  with normal bowel sounds, No bruits or pulsatile masses. Extremity exam No  edema, clubbing or cyanosis, Pulses are 2+ bilaterally. Neurologic exam  Grossly non-focal motor exam.          Impression/Recommendations    ---       **1\. Morbid (severe) obesity due to excess calories**    Clinical Notes: dictated [facility].    ---        Diagnostic Imaging    --- ---    Ardine Bjork: **EKG_    ---       Procedure Codes    ---      93000 IH    ---      Follow Up    ---    prn    Electronically signed by Kandis Mannan Demetrio Leighty MD on 06/20/2017 at 11:00 AM EDT    Sign off status: Completed        * * *        [facility]    [address]    [place], [place] [address]    Tel: [phone]    Fax: [phone]              * * *          Patient: Sierra Nichols, Sierra Nichols DOB: 12-03-1975 Progress Note: [name], MD  06/20/2017    ---    Note generated by eClinicalWorks EMR/PM Software ([email or web address])

## 2017-06-21 ENCOUNTER — Ambulatory Visit

## 2017-06-23 ENCOUNTER — Ambulatory Visit

## 2017-06-30 ENCOUNTER — Ambulatory Visit: Admitting: Surgery

## 2017-07-24 ENCOUNTER — Ambulatory Visit

## 2017-07-24 ENCOUNTER — Ambulatory Visit: Admitting: Family Medicine

## 2017-07-26 ENCOUNTER — Ambulatory Visit

## 2017-07-26 ENCOUNTER — Ambulatory Visit (HOSPITAL_BASED_OUTPATIENT_CLINIC_OR_DEPARTMENT_OTHER): Admitting: Family Medicine

## 2017-07-26 ENCOUNTER — Ambulatory Visit: Admitting: Surgery

## 2017-07-28 ENCOUNTER — Ambulatory Visit

## 2017-07-28 ENCOUNTER — Ambulatory Visit: Admitting: Surgery

## 2017-08-01 ENCOUNTER — Ambulatory Visit: Admitting: Surgery

## 2017-08-01 LAB — HX CBC W/ INDICES
CASE NUMBER: 2019162000633
HX ABSOLUTE NRBC COUNT: 0 10*3/uL
HX HCT: 42.5 % — NL (ref 36.0–47.0)
HX HGB: 13.5 g/dL — NL (ref 11.8–16.0)
HX MCH: 27.4 pg — NL (ref 26.0–34.0)
HX MCHC: 31.8 g/dL — NL (ref 31.0–37.0)
HX MCV: 86.2 fL — NL (ref 80.0–100.0)
HX MPV: 12.4 fL — NL (ref 9.4–12.4)
HX NRBC PERCENT: 0 % — NL
HX PLATELET: 238 10*3/uL — NL (ref 150.0–400.0)
HX RBC: 4.93 10*6/uL — NL (ref 3.9–5.2)
HX RDW-CV: 14.2 % — NL (ref 11.5–14.5)
HX RDW-SD: 45.1 fL — NL (ref 35.0–51.0)
HX WBC: 8.8 10*3/uL — NL (ref 3.7–11.2)

## 2017-08-01 LAB — HX BASIC METABOLIC PANEL
CASE NUMBER: 2019162000633
HX ANION GAP: 7 — NL (ref 3.0–11.0)
HX BUN: 13 mg/dL — NL (ref 6.0–20.0)
HX CALCIUM LVL: 9.2 mg/dL — NL (ref 8.5–10.5)
HX CHLORIDE: 107 mmol/L — NL (ref 98.0–110.0)
HX CO2: 25 mmol/L — NL (ref 21.0–32.0)
HX CREATININE: 0.564 mg/dL — NL (ref 0.55–1.3)
HX GLUCOSE LVL: 81 mg/dL — NL (ref 70.0–110.0)
HX POTASSIUM LVL: 4.1 mmol/L — NL (ref 3.6–5.2)
HX SODIUM LVL: 139 mmol/L — NL (ref 136.0–146.0)

## 2017-08-01 LAB — HX GLOMERULAR FILTRATION RATE (ESTIMATED)
CASE NUMBER: 2019162000633
HX AFN AMER GLOMERULAR FILTRATION RATE: >90
HX NON-AFN AMER GLOMERULAR FILTRATION RATE: >90

## 2017-08-08 ENCOUNTER — Inpatient Hospital Stay
Admit: 2017-08-08 | Disposition: A | Discharge status: Home / Self Care | Source: Ambulatory Visit | Attending: Surgery | Admitting: Surgery

## 2017-08-08 LAB — HX ABO/RH TYPE
CASE NUMBER: 2019169000528
HX ABO/RH TYPE: O NEG — NL

## 2017-08-08 LAB — HX ANTIBODY SCREEN
CASE NUMBER: 2019169000528
HX ANTIBODY SCREEN AUTOMATED: NEGATIVE — NL

## 2017-08-08 NOTE — Op Note (Signed)
cc:  Carrolyn Meiers M.D., [address], [place], [place] [address]    __________________________________________________________________________    OPERATIVE REPORT  DATE:  08/08/2017    PREOPERATIVE DIAGNOSIS:  Morbid obesity.    POSTOPERATIVE DIAGNOSIS:  Morbid obesity.    PROCEDURES:    1.  Laparoscopic sleeve gastrectomy.    2.  Intraoperative endoscopy.    SURGEON:  Essie Christine, M.D.    ASSISTANT:  Charlcie Cradle. Dalpe, R.N.    ANESTHESIA:  General.    ESTIMATED BLOOD LOSS:  50 mL.    FLUIDS:  A liter.    SPECIMENS:  Stomach.    COMPLICATIONS:  None.    DISPOSITION:  The patient tolerated the procedure, was extubated and transferred  to the Recovery Room in stable condition.    INDICATIONS:  This is a 42 year old female who has attempted multiple diet  programs and not able to maintain weight loss here for laparoscopic sleeve  gastrectomy.  The patient consented.  Risks and benefits were explained to the  patient.  Consent was signed.    DESCRIPTION OF PROCEDURE:  The patient was brought to the Operating Room and  placed in the supine position.  Anesthesia monitor was applied.  The patient was  intubated in standard fashion.  Abdominal wall was then prepped and draped in  sterile fashion.  Marcaine 0.5% was used for local anesthesia.  First a 10-mm  trocar was inserted into the left of the umbilical abdominal cavity without  difficulty.  The abdomen was insufflated with CO2 to 15 mmHg pressure.  Three  additional trocars were inserted, a 5-mm to the right upper quadrant, 10-mm  trocar to the right of umbilicus and a 5-mm trocar in the left upper quadrant.   A Nathanson liver retractor was inserted in the subxiphoid incision.  I lifted  the liver up and secured it to the bed with Mediflex.  First I dissected angle  of His, created pocket there and took down the greater curvature vessel down  into the lesser sac.  We took additional greater curvature vessel down and  stopped near the spleen and additional greater curvature  vessel down towards the  pylorus and stopped at 4 cm around the pylorus.  A 36-French bougie was  inserted.  I first fired black staple with SeamGuard and the remaining of gold  staple with SeamGuard.  I staple transected the stomach completely.  The main  attachment to the spleen was taken down with the Harmonic scalpel.  Hemostasis  was inspected and showed no bleeding.  EGD was performed, I visualized the  staple line and there was no active bleeding.  She had a negative bubble test.   I passed the scope without any problem.  I then placed the stomach in an  EndoCatch bag and removed it through the 10-mm trocar on the right side.  The  fascia was closed with 0 Vicryl and skin was then closed with 4-0 Vicryl  subcuticularly.  Steri-Strips and Tegaderm were applied.  At the end of the  procedure, sharps and sponge counts were correct.  The patient tolerated the  procedure, was extubated and transferred to the Recovery Room in stable  condition.    Dictated by:  Maren Beach, M.D.    D:  08/08/2017 08:28:57  T:  08/08/2017 08:45:26  E:  08/08/2017 08:45:26  RRS/wmt  Job# [id number]   SIGNATURE LINE    Electronically signed by Flonnie Hailstone MD, Channing Mutters on 08/15/2017 at 07:39:16 EST

## 2017-08-08 NOTE — H&P (Signed)
Chief Complaint    to have gastric sleeve for weight management    History of Present Illness    [facility] Note        Patient:  Sierra Nichols, Sierra Nichols MRN: [id number] FIN: [id number]    Age:  42 years Sex:  Female DOB:  Nov 06, 1975    Associated Diagnoses:  Morbid obesity due to excess calories    Author:  Flonnie Hailstone MD, Channing Mutters        History of Present Illness    Bariatric Initial Assessment Measurements from flowsheet : Measurements     07/26/2017 15:37  Height  165.1 cm    ---------     Initial Weight CWM  141.5 Kg     Current Weight CWM  135 Kg     Current BMI CWM  49.53     PreOp Weight Goal CWM  134.42 Kg     PreOp Total Weight Change CWM  -6.5 Kg     Ideal Body Weight  56.8 Kg            .        Basic Information    42 year old female attempted multiple diet programs and was not able to  maintain the weight loss, here for lap sleeve gastrectomy with possible hiatal  hernia repair.        Health Status    Allergies:    Allergic Reactions (Selected)    NKA    Current medications:  (Selected)    Documented Medications    Documented    Mirena 52 mg intrauteral device:    Vitamin B Complex: 1 tab(s), PO, Daily    Vitamin B12: S L, Daily    calcium citrate: 600 mg, PO, BID    multivitamin: 1 tab(s), PO, Daily,    Home Medications (5) Active    calcium citrate 600 mg, PO, BID    Mirena 52 mg intrauteral device    multivitamin 1 tab(s), PO, Daily    Vitamin B Complex 1 tab(s), PO, Daily    Vitamin B12 , S L, Daily            Histories    Past Medical History:    No active or resolved past medical history items have been selected or  recorded.    Family History:    Heart disease    Mat. Grandfather    High blood pressure    Mother    High cholesterol    Mother        Procedure history:    Gallbladder removal.    Comments:    06/16/2016 19:37  \- [facility] [place], Dennie Fetters in 2017    Social History:    Social & Psychosocial Habits        No Data Available            Review of Systems    All other systems are  negative        Physical Examination    VS/Measurements:  Vital Signs    07/26/2017 15:37  Systolic Blood Pressure  140 mmHg    ---------     Diastolic Blood Pressure  98 mmHg  HI            .    General:  Alert and oriented, No acute distress.    Eye:  Pupils are equal, round and reactive to light, Normal conjunctiva.    HENT:  Normocephalic, Normal hearing.  Neck:  Supple, Non-tender.    Respiratory:  Lungs are clear to auscultation, Respirations are non-labored,  Symmetrical chest wall expansion.    Cardiovascular:  Normal rate, Regular rhythm, No edema.    Gastrointestinal:  Soft, Non-tender, Non-distended.    Musculoskeletal    Normal range of motion.    Normal strength.    No tenderness.    Integumentary:  Warm, Dry, Pink.    Neurologic:  Alert, Oriented, Normal sensory, Normal motor function.    Cognition and Speech:  Oriented, Speech clear and coherent, Functional  cognition intact.    Psychiatric:  Cooperative, Appropriate mood & affect, Normal judgment.        Impression and Plan    General Exam:    Diagnosis    Morbid obesity due to excess calories (ICD10-CM E66.01, Discharge, Medical).    .    Course: risk and benefit explained    questions answered.    consent signed.    OR for lap sleeve gastrectomy with possible hiatal hernia repair.Marland Kitchen        History and Physical reviewed and no change    CMS Two-Midnight Rule    Problem List/Past Medical History    Ongoing    Obesity    Urgency - urination    Historical    No qualifying data    Procedure/Surgical History    (as child) urethra extension, 2017 Gallbladder removal, wisdom teeth  extraction.    Social History    _Alcohol_    None    _Substance Abuse_    Never    Family History    Heart disease: Mat. Grandfather.    High blood pressure: Mother.    High cholesterol: Mother.    Allergies    NKA    Medications    _Inpatient_    No active inpatient medications    _Home_    calcium citrate, 600 mg, PO, BID    Mirena 52 mg intrauteral device, 52 mg= 1  EA, Intrauteral, continuous    multivitamin, 1 tab(s), PO, Daily    Vitamin B Complex, 1 tab(s), PO, Daily    Vitamin B12, S L, Daily    Diet    No qualifying data available.    Lab Results    Urine Pregnancy POC: Negative (08/08/17 06:43:00)    Quality Control Pos Urine Pregnancy POC: Yes (08/08/17 06:43:00)    Blood Glucose POC: 176 mg/dL High (16/10/96 04:54:09)    Diagnostic Results        SIGNATURE LINE Electronically signed by Flonnie Hailstone MD, Nanie Dunkleberger on 08/08/2017 at 07:19:05  EST

## 2017-08-08 NOTE — Progress Notes (Signed)
Subjective    c/o nausea and vomiting    pain ok    Objective    Vitals & Measurements    **T: **98.0 F  (Oral) **HR: **54 **RR: **18 **BP: **146/84 **SpO2: **96%    **HT: **165 cm **WT: **135 Kg **BMI: **49.59    General: Alert and oriented, no acute distress    Respiratory: respirations non-labored, breath sounds equal and regular,  symmetrical chest expansion, no chest wall tenderness    Cardiovascular: Normal Rate, normal rhythm 54 beats/minute, normal peripheral  perfusion, no edema.    Gastrointestinal: Abdomen soft, non tender, non-distended, normal bowel sounds  wound ok          Impression and Plan    Morbid obesity due to excess calories    POD#1 lap sleeve gastrectomy    add haldol for nausea        Medications    _Inpatient_    Dilaudid, 0.5 mg= 0.5 mL, IV Push, q3hr, PRN    diphenhydrAMINE, 25 mg= 0.5 mL, IV Push, q6hr, PRN    enoxaparin, 40 mg= 0.4 mL, sc, q24hr    Haldol, 1 mg, IM, Once    Lactated Ringers 1,000 mL, 1000 mL, IV    ondansetron, 4 mg= 2 mL, IV Push, q4hr, PRN    oxyCODONE, 5 mg, PO, q4hr, PRN    oxyCODONE, 10 mg, PO, q4hr, PRN    Protonix, 40 mg= 1 EA, IV Push, q24hr    Reglan, 10 mg= 2 mL, IV Push, q6hr, PRN    Tylenol, 650 mg, PO, q4hr, PRN    Lab Results    Glucose Lvl: 103 mg/dL (43/32/95 18:84:16)    BUN: 7 mg/dL (60/63/01 60:10:93)    Creatinine: 0.525 mg/dL Low (23/55/73 22:02:54)    Afn Amer Glomerular Filtration Rate: >90 (08/09/17 05:34:00)    Non-Afn Amer Glomerular Filtration Rate: >90 (08/09/17 05:34:00)    Sodium Lvl: 137 mmol/L (08/09/17 05:34:00)    Potassium Lvl: 4.1 mmol/L (08/09/17 05:34:00)    Chloride: 103 mmol/L (08/09/17 05:34:00)    CO2: 26 mmol/L (08/09/17 05:34:00)    Anion Gap: 8 (08/09/17 05:34:00)    Calcium Lvl: 8.5 mg/dL (27/06/23 76:28:31)    Phosphorus: 2.1 mg/dL Low (51/76/16 07:37:10)    Magnesium Lvl: 2.2 mg/dL (62/69/48 54:62:70)    WBC: 17.9 thous/mm3 High (08/09/17 05:34:00)    RBC: 4.65 Mil/mm3 (08/09/17 05:34:00)    Hgb: 12.7 Gm/dL  (35/00/93 81:82:99)    Hct: 39.1 % (08/09/17 05:34:00)    Platelet: 223 thous/mm3 (08/09/17 05:34:00)    MCV: 84.1 fL (08/09/17 05:34:00)    MCH: 27.3 pGm (08/09/17 05:34:00)    MCHC: 32.5 Gm/dL (37/16/96 78:93:81)    RDW-SD: 42.3 fL (08/09/17 05:34:00)    MPV: 12.4 fL (08/09/17 05:34:00)    NRBC Percent: 0 % (08/09/17 05:34:00)    Absolute NRBC Count: 0 thous/mm3 (08/09/17 05:34:00)    Diagnostic Results        ------        SIGNATURE LINE Electronically signed by Flonnie Hailstone MD, Lashawn Orrego on 08/09/2017 at 08:11:11  EST

## 2017-08-08 NOTE — Consults (Signed)
__________________________________________________________________________    CONSULTATION  DATE:  08/08/2017    PRIMARY CARE PHYSICIAN:  Carrolyn Meiers, M.D.    REFERRING PHYSICIAN:  Essie Christine, M.D.    REASON FOR CONSULTATION:  Perioperative evaluation.    HISTORY OF PRESENT ILLNESS:  This 42 year old female with a history of morbid  obesity who presents for preoperative evaluation.  She is to undergo a gastric  sleeve at some point in the weeks or months to come.  Of note, is that her  sister is having bariatric surgery next Monday.    She does not have cardiovascular symptoms.  She is not particularly active.  She  has some degree of fatigue with yard work.  This is not a new symptom.  She has  no chest pain, chest tightness or chest heaviness.  No frank dyspnea.  No  orthopnea, PND, edema, palpitations or presyncope.    No history of hypertension.  Her father was a smoker and died of an intracranial  hemorrhage in his 1s.  There is no clearcut premature coronary disease in her  family.  There is no diabetes or hypertension.    The patient does occasionally have lower extremity edema.  This tends to occur  after a salt load.  She has lost 10 pounds over the last few months with designs  of losing another 10 before surgery.    She has been pregnant twice.  Neither was complicated.  She had substantial  weight gain at the time of her second pregnancy nine years ago.  She does not  carry the diagnosis of sleep apnea.    PAST MEDICAL HISTORY:  None.    MEDICATIONS:  None.    PAST SURGICAL HISTORY:  None.    MEDICATIONS:  1.  Vitamin B12.  2.  Multivitamin.  3.  Calcium carbonate.    SOCIAL HISTORY:  No tobacco or alcohol abuse.  Two children, ages 31 and 19.  She  is a Engineer, manufacturing full time.    FAMILY HISTORY:  As above.    REVIEW OF SYSTEMS:  Remainder reviewed and negative.    PHYSICAL EXAMINATION:  GENERAL:  Pleasant female in no distress.  VITAL SIGNS:   Blood pressure 110 and 110 on repeat.  Pulse 68.  Weight  300.  HEENT:   Unremarkable.  NECK:  Carotids 2+ without bruits.  CVP normal.  HEART:  Regular  rate and rhythm.  No murmur.  No S3.  CHEST:  Lungs clear to auscultation  bilaterally.  ABDOMEN:  Soft.  EXTREMITIES:  No clubbing or cyanosis.  No  pitting edema.  Intact distal pulses.  NEUROLOGIC:  Nonfocal.    CARDIOLOGY DATA:  EKG shows sinus rhythm, borderline first-degree heart block.    IMPRESSION AND PLAN:  A 42 year old female who is to undergo bariatric surgery  later this year.  She is at low perioperative cardiac risk.  She and I discussed  the importance of early mobility postop for prevention of venous clot.  No  additional testing required.      Please feel free to contact me regarding any issues that should arise.    Dictated by:  Mohammed Kindle, M.D.    D:  06/20/2017 10:57:32  T:  06/20/2017 11:11:15  E:  06/20/2017 11:11:15  OA/wmt  Job# [id number]   SIGNATURE LINE    Electronically signed by Karie Mainland MD, Pasty Arch on 07/28/2017 at 17:47:47 EST

## 2017-08-08 NOTE — Op Note (Signed)
Pre-op Diagnosis    Morbid obesity    Post-op Diagnosis    Morbid obesity    Procedures    Date Procedure Modifier Comments    08/08/17 Gastrectomy Laparoscopic Sleeve  N/A      Case Attendees    Attendee Role    Dalpe RN, BSN, Evert Kohl First Assistant    Haque MD, Butler Denmark UI Anesthesiologist of Record    Flonnie Hailstone MD, Channing Mutters Primary Surgeon      Anesthesia Type    Oran Rein MD, Butler Denmark UI (Supervisor)    Merilyn Baba CRNA, Elliot Dally (Provider)    Estimated Blood Loss    5.0 mL    Transfusions    Transfusions    No qualifying data available.    Catheters, Tubes, Drains    Catheters, Drains, and Tubes    No qualifying data available.    Operative Implants    *** No implants in past 24 hours ***    Tourniquet    Operative Specimens    Date/Time Obtained Specimen Description Frozen Section Tests Requested    08/08/17 8:03:00 EDT Partial stomach No Pathology Tissue Exam Request          Operative Fluids    - Parenteral in ml    - Drains in ml    - Urine Output in ml    Findings    Complications    Discharge Status    OR Disposition        ------        SIGNATURE LINE Electronically signed by Flonnie Hailstone MD, Adalida Garver on 08/08/2017 at 15:06:10  EST

## 2017-08-08 NOTE — Progress Notes (Signed)
Subjective    doing better    pain ok    drinking    Objective    Vitals & Measurements    **T: **98.2 F  (Oral) **HR: **54 **RR: **18 **BP: **137/90 **SpO2: **96%    **HT: **165 cm **WT: **135 Kg **BMI: **49.59    General: Alert and oriented, no acute distress    Respiratory: respirations non-labored, breath sounds equal and regular,  symmetrical chest expansion, no chest wall tenderness    Cardiovascular: Normal Rate, normal rhythm, 54 beats/minute, normal peripheral  perfusion, no edema.    Gastrointestinal: Abdomen soft, non tender, non-distended, normal bowel sounds          Impression and Plan    Morbid obesity due to excess calories    POD#2 lap sleeve gastrectomy    home        Medications    _Inpatient_    Dilaudid, 0.5 mg= 0.5 mL, IV Push, q3hr, PRN    diphenhydrAMINE, 25 mg= 0.5 mL, IV Push, q6hr, PRN    enoxaparin, 40 mg= 0.4 mL, sc, q24hr    Lactated Ringers 1,000 mL, 1000 mL, IV    ondansetron, 4 mg= 2 mL, IV Push, q4hr, PRN    oxyCODONE, 5 mg= 5 mL, PO, q4hr, PRN    oxyCODONE, 10 mg= 10 mL, PO, q4hr, PRN    Protonix, 40 mg= 1 EA, IV Push, q24hr    Reglan, 10 mg= 2 mL, IV Push, q6hr, PRN    Tylenol, 650 mg= 20.31 mL, PO, q4hr, PRN    Lab Results    No labs resulted in the past 24 hours.    Diagnostic Results        ------        SIGNATURE LINE Electronically signed by Flonnie Hailstone MD, Quintan Saldivar on 08/10/2017 at 08:32:44  EST

## 2017-08-09 LAB — HX BASIC METABOLIC PANEL
CASE NUMBER: 2019170000456
HX ANION GAP: 8 — NL (ref 3.0–11.0)
HX BUN: 7 mg/dL — NL (ref 6.0–20.0)
HX CALCIUM LVL: 8.5 mg/dL — NL (ref 8.5–10.5)
HX CHLORIDE: 103 mmol/L — NL (ref 98.0–110.0)
HX CO2: 26 mmol/L — NL (ref 21.0–32.0)
HX CREATININE: 0.525 mg/dL — ABNORMAL LOW (ref 0.55–1.3)
HX GLUCOSE LVL: 103 mg/dL — NL (ref 70.0–110.0)
HX POTASSIUM LVL: 4.1 mmol/L — NL (ref 3.6–5.2)
HX SODIUM LVL: 137 mmol/L — NL (ref 136.0–146.0)

## 2017-08-09 LAB — HX GLOMERULAR FILTRATION RATE (ESTIMATED)
CASE NUMBER: 2019170000456
HX AFN AMER GLOMERULAR FILTRATION RATE: >90
HX NON-AFN AMER GLOMERULAR FILTRATION RATE: >90

## 2017-08-09 LAB — HX CBC W/ INDICES
CASE NUMBER: 2019170000456
HX ABSOLUTE NRBC COUNT: 0 10*3/uL
HX HCT: 39.1 % — NL (ref 36.0–47.0)
HX HGB: 12.7 g/dL — NL (ref 11.8–16.0)
HX MCH: 27.3 pg — NL (ref 26.0–34.0)
HX MCHC: 32.5 g/dL — NL (ref 31.0–37.0)
HX MCV: 84.1 fL — NL (ref 80.0–100.0)
HX MPV: 12.4 fL — NL (ref 9.4–12.4)
HX NRBC PERCENT: 0 % — NL
HX PLATELET: 223 10*3/uL — NL (ref 150.0–400.0)
HX RBC: 4.65 10*6/uL — NL (ref 3.9–5.2)
HX RDW-CV: 13.7 % — NL (ref 11.5–14.5)
HX RDW-SD: 42.3 fL — NL (ref 35.0–51.0)
HX WBC: 17.9 10*3/uL — ABNORMAL HIGH (ref 3.7–11.2)

## 2017-08-09 LAB — HX MAGNESIUM LEVEL
CASE NUMBER: 2019170000456
HX MAGNESIUM LVL: 2.2 mg/dL — NL (ref 1.7–2.5)

## 2017-08-09 LAB — HX PHOSPHORUS LEVEL
CASE NUMBER: 2019170000456
HX PHOSPHORUS: 2.1 mg/dL — ABNORMAL LOW (ref 2.4–4.9)

## 2017-08-11 LAB — HX SURG PATH FINAL REPORT
CASE NUMBER: 0
HX NOTE: 88307

## 2017-08-12 ENCOUNTER — Ambulatory Visit (HOSPITAL_BASED_OUTPATIENT_CLINIC_OR_DEPARTMENT_OTHER): Admitting: Family Medicine

## 2017-08-15 ENCOUNTER — Ambulatory Visit

## 2017-08-16 ENCOUNTER — Ambulatory Visit: Admitting: Surgery

## 2017-10-04 ENCOUNTER — Ambulatory Visit: Admitting: Surgery

## 2017-11-03 ENCOUNTER — Ambulatory Visit: Admitting: Surgery

## 2017-12-06 ENCOUNTER — Ambulatory Visit: Admitting: Family Medicine

## 2017-12-06 ENCOUNTER — Ambulatory Visit: Admitting: Surgery

## 2017-12-06 LAB — HX TSH REFLEX PANEL (RECOMMENDED)
CASE NUMBER: 2019289002220
HX 3RD GEN TSH: 1.71 u[IU]/mL — NL (ref 0.358–3.74)

## 2017-12-06 LAB — HX FOLATE LEVEL
CASE NUMBER: 2019289002220
HX FOLATE LVL: >20 — NL

## 2017-12-06 LAB — HX CBC W/ INDICES
CASE NUMBER: 2019289002220
HX ABSOLUTE NRBC COUNT: 0 10*3/uL
HX HCT: 40.3 % — NL (ref 36.0–47.0)
HX HGB: 12.9 g/dL — NL (ref 11.8–16.0)
HX MCH: 27.3 pg — NL (ref 26.0–34.0)
HX MCHC: 32 g/dL — NL (ref 31.0–37.0)
HX MCV: 85.4 fL — NL (ref 80.0–100.0)
HX MPV: 13 fL — ABNORMAL HIGH (ref 9.4–12.4)
HX NRBC PERCENT: 0 % — NL
HX PLATELET: 208 10*3/uL — NL (ref 150.0–400.0)
HX RBC: 4.72 10*6/uL — NL (ref 3.9–5.2)
HX RDW-CV: 14.6 % — ABNORMAL HIGH (ref 11.5–14.5)
HX RDW-SD: 46.3 fL — NL (ref 35.0–51.0)
HX WBC: 7.8 10*3/uL — NL (ref 3.7–11.2)

## 2017-12-06 LAB — HX VITAMIN B12 LEVEL
CASE NUMBER: 2019289002220
HX VITAMIN B12 LVL: 468 pg/mL — NL (ref 193.0–986.0)

## 2017-12-06 LAB — HX COMPREHENSIVE METABOLIC PANEL
CASE NUMBER: 2019289002220
HX ALBUMIN LVL: 3.7 g/dL — NL (ref 3.2–5.0)
HX ALKALINE PHOSPHATASE: 92 U/L — NL (ref 30.0–117.0)
HX ALT: 28 U/L — NL (ref 6.0–55.0)
HX ANION GAP: 8 — NL (ref 3.0–11.0)
HX AST: 15 U/L — NL (ref 6.0–40.0)
HX BILIRUBIN TOTAL: 0.4 mg/dL — NL (ref 0.2–1.2)
HX BUN: 11 mg/dL — NL (ref 6.0–20.0)
HX CALCIUM LVL: 9 mg/dL — NL (ref 8.5–10.5)
HX CHLORIDE: 106 mmol/L — NL (ref 98.0–110.0)
HX CO2: 25 mmol/L — NL (ref 21.0–32.0)
HX CREATININE: 0.605 mg/dL — NL (ref 0.55–1.3)
HX GLUCOSE LVL: 108 mg/dL — NL (ref 70.0–110.0)
HX POTASSIUM LVL: 3.7 mmol/L — NL (ref 3.6–5.2)
HX SODIUM LVL: 139 mmol/L — NL (ref 136.0–146.0)
HX TOTAL PROTEIN: 7.1 g/dL — NL (ref 6.0–8.4)

## 2017-12-06 LAB — HX GLOMERULAR FILTRATION RATE (ESTIMATED)
CASE NUMBER: 2019289002220
HX AFN AMER GLOMERULAR FILTRATION RATE: >90
HX NON-AFN AMER GLOMERULAR FILTRATION RATE: >90

## 2017-12-06 LAB — HX LIPID PANEL
CASE NUMBER: 2019289002220
HX CHOL: 203 mg/dL — ABNORMAL HIGH
HX HDL: 45 mg/dL — NL
HX LDL: 141 mg/dL — ABNORMAL HIGH
HX TRIG: 87 mg/dL — NL

## 2017-12-06 LAB — HX VITAMIN D 25 HYDROXY LEVEL (RECOMMENDED)
CASE NUMBER: 2019289002220
HX VITAMIN D 25 OH LVL: 35 ng/mL — NL (ref 30.0–100.0)

## 2017-12-06 NOTE — Progress Notes (Addendum)
---    **Progress Notes**    ---    **Patient:** Sierra Nichols   **Provider:** Montoya Brandel R. Christella Scheuermann, M.D.     **DOB:** [birth year] **Age:** 42 Y **Sex:** Female   **Date:** 12/06/2017             * * *        ---         **Reason for Appointment**    ---       1\. CODER NOTES: Per Cardio 06/20/17 review E66.01 (morbid severe obesity  due to excess calories). If patient's BMI is over 40 code appropriate BMI code  Z68.4X.    ---    2\. CPE    ---       **History of Present Illness**    ---     _Depression Screening_ :    PHQ-2 (2015 Edition) Little interest or pleasure in doing things? Not at all,  Feeling down, depressed, or hopeless? Not at all, Total Score 0\.    s/p Gastric bypass: surgery, eating about 1000 cal a day, minimal carbs, some  protein, with protein drink, with good supply of fruits and vegetables.       **Current Medications**    ---    Taking     * Mirena     ---    * Omeprazole 10 MG Capsule Delayed Release 1 capsule Orally Once a day, Notes: unsure of dosage    ---    * Vitamin B12 500 MCG Tablet as directed Orally     ---    * Calcium 500 MG Tablet 4 tablet with meals Orally Twice a day    ---    * Multivitamin Adult - Tablet as directed Orally     ---    * Biotin 10 MG Tablet 1 tablet Orally Once a day    ---    * Vitamin B Complex-C 1 tab each morning on empty stomach and 1 1/2 tabs on sunday Orally daily    ---    * Medication List reviewed and reconciled with the patient    ---       **Past Medical History**    ---       Cholecystitis.        ---    Contraception, placed Feb 2018( 2nd).        ---    Error of refraction.        ---    BMI> 40.        ---    Mammogram at 8 was normal.        ---    Family hx of Maternal GM with breast cancer postmenopausal(double mastectomy).        ---    Last pap was 03/2016, was normal, [facility], due for appt in Dec.        ---    Lipoma, right posterior chest.        ---    Last eye exam, Jan 2019.        ---    Hep B immunized, one shot from [facility], 2nd shot given 2018.        ---         --- ---       **Surgical History**    ---       Laparoscopic surgery, for GB 07/2016    ---    Colposcopy for HPV positive    ---  Sleeve gastrectomy 08/08/17    ---       **Family History**    ---       Father: deceased 24 yrs, Brain aneurysm, died in 50    ---    Mother: alive 57 yrs, On medication, htn, diagnosed with Hypertension    ---    Sister: alive 81 yrs, healthy    ---    Paternal Grand Father: deceased 28 yrs, possibly heart attack,    ---    Paternal Grand Mother: deceased 44 yrs, 42, died in her sleep    ---    Maternal Grand Father: deceased, COPD, congestive heart failure, 41 yrs old.    ---    Maternal Grand Mother: deceased 71 yrs, breast cancer, in her late 53's  postmenopausal, Cancer    ---    Maternal uncle: alive 37 yrs, kidney, lung and brain cancers    ---    1 sister(s) - healthy. 2daughter(s) - healthy.    ---    2 daughters are healthy 9 and 29.    ---       **Social History**    ---    Children: daughter(s) 2.    Sun Screen: uses daily.    Exercise: walking, yard work and light weight..    Seat belts: always.    Mammogram: current.    Alcohol alcohol occasional 2x a week, beer or wine.    Sexually active: significant other only.    Pets: dog.    Marital Status: single.    Marijuana: no.    Diet: no specific.    Education: BS/BA in [place], for 1 yr.    Living with: with boyfriend( not father of the kids), mom and kids.    Guns in home: none.    Tobacco Use Status: never smoker, Patient counseled on cessation: 12/06/2017.    Occupation: Print production planner, [employer].    Domestic violence: none.    Feels safe in home: yes.      **Gyn History**    ---    LMP irregular, since after surgery, relatively heavy..      **OB History**    ---    Total living children 2\.    Pregnancy # 1: NSVD.    Pregnancy # 2: NSVD.      **Allergies**    ---       N.K.D.A.    ---       **Hospitalization/Major Diagnostic Procedure**    ---       as above    ---       **Vital Signs**    ---    Temp 98.9, Weight Change -64.6 lb, BP 114/68, HR 76, Ht 66, Wt 243.4, BMI  39.28, RR 16, Oxygen sat % 96.       **Examination**    ---     _General Examination_ :    GENERAL APPEARANCE: well developed and well nourished, mildly overweight.  HEENT: Head - NC/AT, EOMI, PERRL,clear conjunctivae, anicteric,pharynx and  tonsils normal, no lymphadenopathies, no carotid bruits, no jugular venous  distention.Marland Kitchen FACE:  No asymmetry. NECK: supple, no lymphadenopathy. HEART:  RSR/RRR, normal S1S2, no murmurs, no heaves or thrills, PMI at fifth  intercostal space left midclavicular line.. LUNGS: good air entry bilaterally,  clear to auscultation, comfortable and easy respirations.Marland Kitchen BREASTS:  symmetrical, no palpable masses bilaterally, no lumps felt on either side, no  nipple retraction, no nipple drainage, no axillary lymphadenopathy. ABDOMEN:  normoactive BS, soft,  NT/ND, BS present, no hepatosplenomegaly. BACK:  unremarkable, normal, normal range of motion of spine, no paraspinal  tenderness, normal alignment, no CVA tenderness, no tenderness. EXTREMITIES:  no deformities. NEUROLOGIC EXAM:  Grossly nonfocal. SKIN: normal, no rash, a  few freckles especially on upper shoulders and back. FEMALE GENITOURINARY:  Deferred to GYN. PSYCHOLOGICAL:  euthymic mood and affect, normal speech,  oriented x 3 , no thought disorder , no SI/HI, no flight of ideas.          **Assessments**    ---    1\. Encounter for general adult medical examination with abnormal findings -  Z00.01 (Primary)    ---    2\. Influenza vaccine administered - Z23    ---    3\. Constipation, unspecified constipation type - K59.00    ---    4\. Irregular bleeding - N92.6    ---    5\. BMI 39.0-39.9,adult - Z68.39    ---    6\. S/P gastric bypass - Z98.84    ---    7\. Contraception, device intrauterine - Z97.5    ---       **Treatment**    ---       **1\. Encounter for general adult medical examination with abnormal  findings**     _LAB: CBC w/ Indices_ Normal   WBC  7.8    3.7-11.2 - thous/mm3    --- --- --- ---    RBC  4.72    3.90-5.20 - Mil/mm3    --- --- --- ---    Hgb  12.9    11.8-16.0 - Gm/dL    --- --- --- ---    Hct  40.3    36.0-47.0 - %    --- --- --- ---    MCV  85.4    80.0-100.0 - fL    --- --- --- ---    MCH  27.3    26.0-34.0 - pGm    --- --- --- ---    MCHC  32.0    31.0-37.0 - Gm/dL    --- --- --- ---    Platelet  208    150-400 - thous/mm3    --- --- --- ---    RDW-SD  46.3    35.0-51.0 - fL    --- --- --- ---    MPV  13.0  H  9.4-12.4 - fL    --- --- --- ---    _LAB: Comprehensive Metabolic Panel_ Normal Creatinine  0.605    0.550-1.300 -  mg/dL    --- --- --- ---    Sodium Lvl  139    136-146 - mmol/L    --- --- --- ---    Potassium Lvl  3.7    3.6-5.2 - mmol/L    --- --- --- ---    Chloride  106    98-110 - mmol/L    --- --- --- ---    CO2  25    21-32 - mmol/L    --- --- --- ---    Total Protein  7.1    6.0-8.4 - Gm/dL    --- --- --- ---    Albumin Lvl  3.7    3.2-5.0 - Gm/dL    --- --- --- ---    Calcium Lvl  9.0    8.5-10.5 - mg/dL    --- --- --- ---    Glucose Lvl  108    70-110 - mg/dL    --- --- --- ---  Bili Total  0.4    0.2-1.2 - mg/dL    --- --- --- ---    Alk Phos  92    30-117 - Units/L    --- --- --- ---    AST  15    6-40 - Units/L    --- --- --- ---    ALT  28    6-55 - Units/L    --- --- --- ---    Anion Gap  8    3-11 -    --- --- --- ---    BUN  11    6-20 - mg/dL    --- --- --- ---    _LAB: Lipid Panel_ sl high LDL-141, otherwise normal Chol  203  H  <=200 -  mg/dL    --- --- --- ---    HDL  45    >=40 - mg/dL    --- --- --- ---    Trig  87    <=150 - mg/dL    --- --- --- ---    LDL  141  H  <=130 - mg/dL    --- --- --- ---    Status  Done     \-    --- --- --- ---        _Imaging: [facility] Breast Screening e-sch*_    Notes: healthy lifestyle habits including consistent exercise, moderation in  alcohol if any, wearing seatbelts and sunblock advised, Tdap updated, annual  flu shot  recommended in the fall, and received today.Reinforced routine GYN  visits.        **2\. Constipation, unspecified constipation type**    Notes: Fiber in fluid intake, fairly sufficient patient admits it is  manageable with dietary modification.        **3\. Irregular bleeding**    _LAB: TSH Reflex Panel (Recommended)_ Normal   3rd Gen TSH  1.710     0.358-3.740 - mclU/ml    --- --- --- ---        Notes: On and off, patient willing to wait till she sees the gynecologist at  [facility].        **4\. BMI 39.0-39.9,adult**    _LAB: Hemoglobin A1c_ Normal   Mean Bld Glucos  100     \- mg/dL    --- --- --- ---    Glycated Hgb  5.1    <=5.6 - %    --- --- --- ---        Notes: Vigilant with her diet and exercise, has already shed off close to 60  pounds since her gastric bypass surgery continue to monitor.        **5\. S/P gastric bypass**    Continue Omeprazole Capsule Delayed Release, 10 MG, 1 capsule, Orally, Once a  day, Notes: unsure of dosage    Continue Vitamin B12 Tablet, 500 MCG, as directed, Orally    Continue Multivitamin Adult Tablet, -, as directed, Orally    Continue Biotin Tablet, 10 MG, 1 tablet, Orally, Once a day    Continue Vitamin B Complex-C, 1 tab each morning on empty stomach and 1 1/2  tabs on sunday, Orally, daily    _LAB: Folate Level_ Normal   Folate Lvl  >20.0    >=5.4 - nGm/ml    --- --- --- ---    _LAB: Vitamin B12 Level_ Normal Vitamin B12 Lvl  468    193-986 - pg/mL    --- --- --- ---    _LAB: Vitamin D  25 Hydroxy Level_ Normal Vitamin D 25 OH Lvl  35    30-100 -  nGm/ml    --- --- --- ---        Notes: Reinforced follow-up with the surgeon for a few months to 1-2 years and  once is stable may be transitioned to me or PCP. Continue above medications  for nutritional supplementation.        **6\. Contraception, device intrauterine**    Continue Mirena    Notes: Surveillance as per GYN.      **Immunization**    ---       INFLUENZA OFFICE SUPPLIED 18+ : 0.5 mL (Route: Intramuscular) given  by  Orlando Penner on Left Deltoid (Influenza vaccine administered)    ---       **Visit Codes**    ---       94854 PREV MED VISIT EST PT 40-64 YR.    ---      **Procedure Codes**    ---       62703 INFLUENZA OFFICE SUPPLIED 18+    ---    50093 IMMUNIZATION ADMIN    ---       **Follow Up**    ---    1 Year, prn    Electronically signed by Carrolyn Meiers , MD on 12/09/2017 at 09:40 AM EDT    Sign off status: Completed          * * *      Patient: Sierra Nichols   Provider: Beckie Busing. Christella Scheuermann, M.D.    --- ---    DOB: [birth year]  Date: 12/06/2017    Note generated by eClinicalWorks EMR/PM Software ([email or web address])    ---

## 2017-12-07 LAB — HX HEMOGLOBIN A1C
CASE NUMBER: 2019289002220
HX EST AVERAGE GLUCOSE (EAG): 100 mg/dL
HX HEMOGLOBIN A1C: 5.1 % — NL
HX LA1C (INTERNAL): 1.6 % — NL
HX P3 PEAK (INTERNAL): 5 % — NL
HX TOTAL AREA RANGE (INTERNAL): 139887 microvolt/sec — NL (ref 50000.0–350000.0)

## 2017-12-09 ENCOUNTER — Ambulatory Visit: Admitting: Family Medicine

## 2017-12-09 NOTE — Progress Notes (Addendum)
* * *        **  Sierra Nichols    --- ---    5 Y old Female, DOB: 08-Jan-1976    [address], [place], [place] [address]    Home: [phone]    Provider: Carrolyn Meiers        * * *    Telephone Encounter    ---    Answered by   Carrolyn Meiers  Date: 12/09/2017         Time: 05:33 AM    Reason   lab results, sms sent    --- ---            Action Taken                      Lyn Joens  12/09/2017 5:39:05 AM > sms sent      Denaja Verhoeven  12/09/2017 5:41:15 AM > webmail also sent                    * * *         **eMessages**   From:   Severin Bou    --- ---    Created:   2017-12-09 05:41:02    Sent:      Subject:   RE:lab results, sms sent    Message:                      Kenzly Rogoff  12/09/2017 5:39:05 AM > sms sent    Hi Boneta Lucks, I am pleased to inform you that your lab results showed normal CBC,  kidney, electrolytes, liver, thyroid, Diabetes screen, Vitamin B12, Vitamin D  and folic acid levels. Fasting blood sugar is slightly elevated but not in the  diabetic range. Your cholesterol showed slightly elevation of LDL. I encourage  you to continue with agressive lifestyle modification. No medication is needed  at this time. Please call our office should you have any questions. Regards,  Dr. Christella Scheuermann                        ---          * * *          Patient: FIORELA, PELZER R DOB: [birth year] Provider: Carrolyn Meiers 12/09/2017    ---    Note generated by eClinicalWorks EMR/PM Software ([email or web address])

## 2018-02-02 ENCOUNTER — Ambulatory Visit: Admitting: Surgery

## 2018-02-09 ENCOUNTER — Ambulatory Visit: Admitting: Surgery

## 2018-06-15 ENCOUNTER — Ambulatory Visit: Admitting: Surgery

## 2018-08-08 ENCOUNTER — Ambulatory Visit: Admitting: Surgery

## 2018-11-13 IMAGING — MG DIGITAL DIAGNOSTIC UNILATERAL RIGHT MAMMOGRAM WITH TOMO AND CAD
6 series · 6 of 18 positions shown · non-contrast
Comparison: 05/29/2017, 05/23/2016, and 12/17/2009

CLINICAL DATA: Patient returns after screening study for evaluation
of possible RIGHT breast asymmetry.

EXAM:
DIGITAL DIAGNOSTIC RIGHT MAMMOGRAM WITH CAD AND TOMO
ULTRASOUND RIGHT BREAST

[R MLO synth-2D]
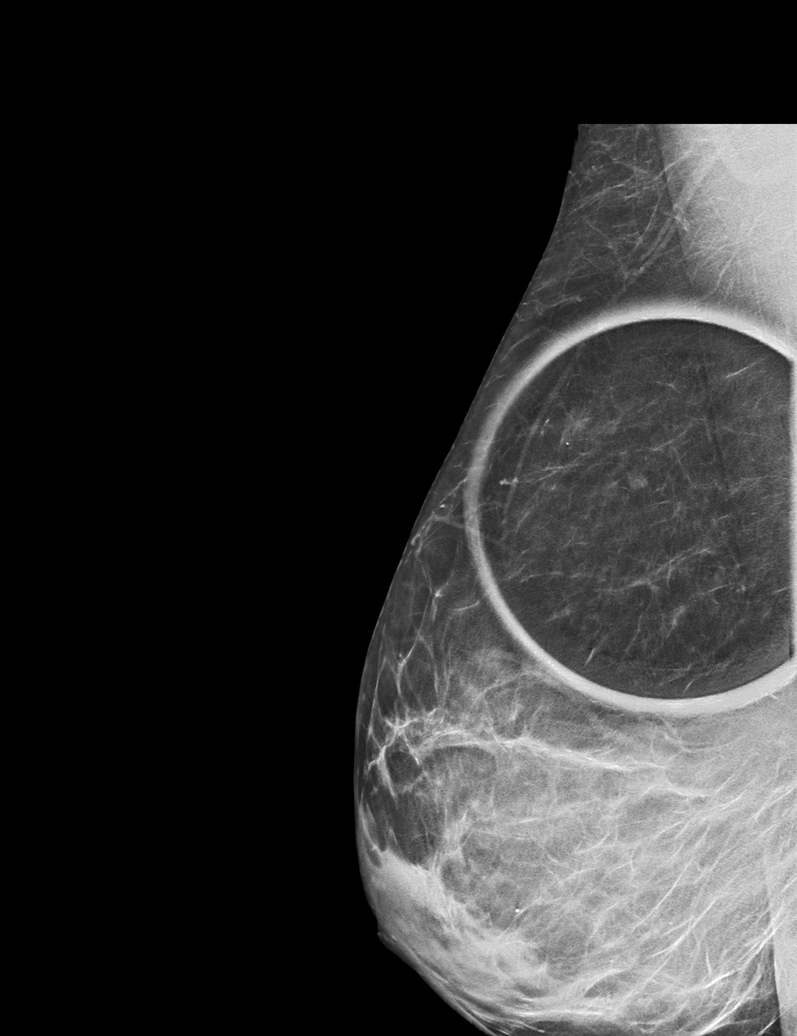

[R ML synth-2D]
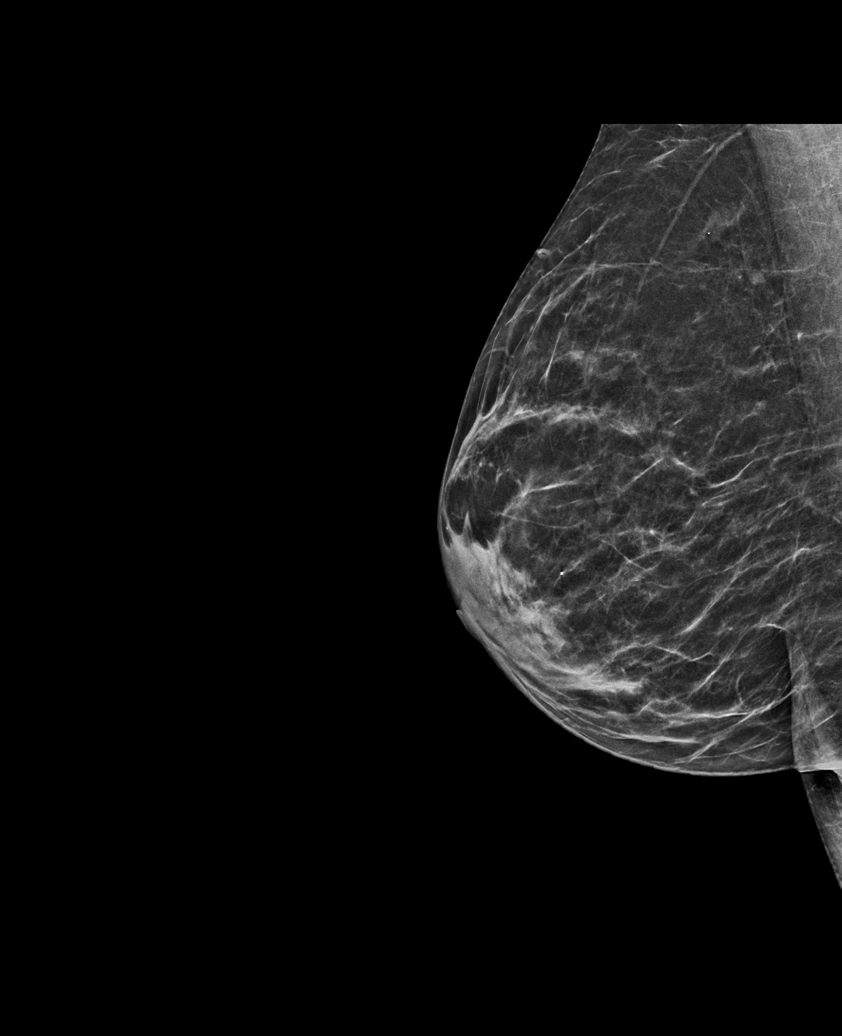

[R XCCL synth-2D]
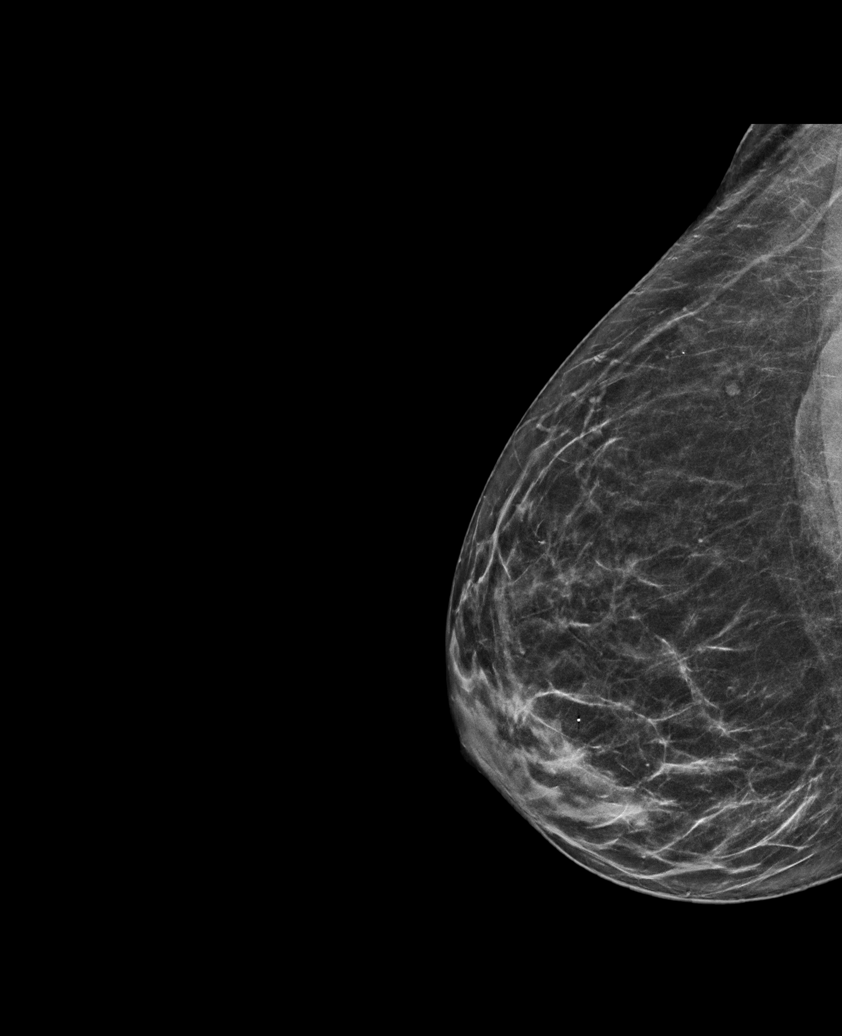

[R ML tomo · tomo slice 27/54.0]
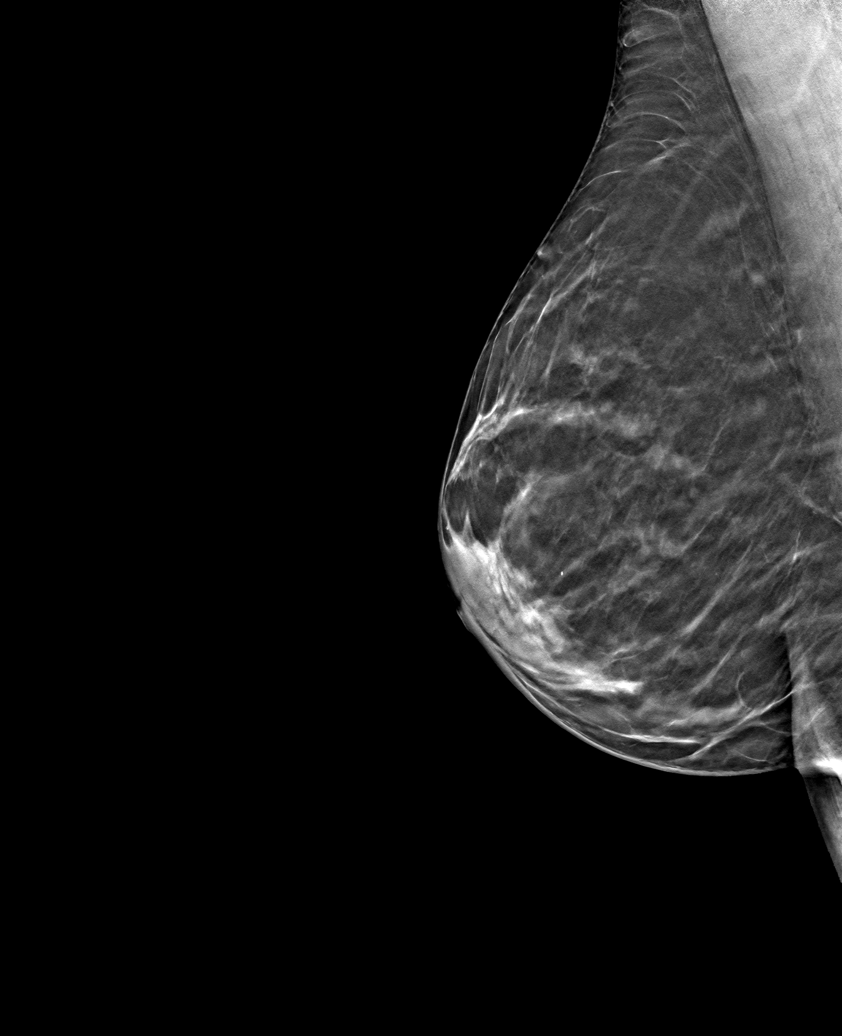

[R XCCL tomo · tomo slice 31/60.0]
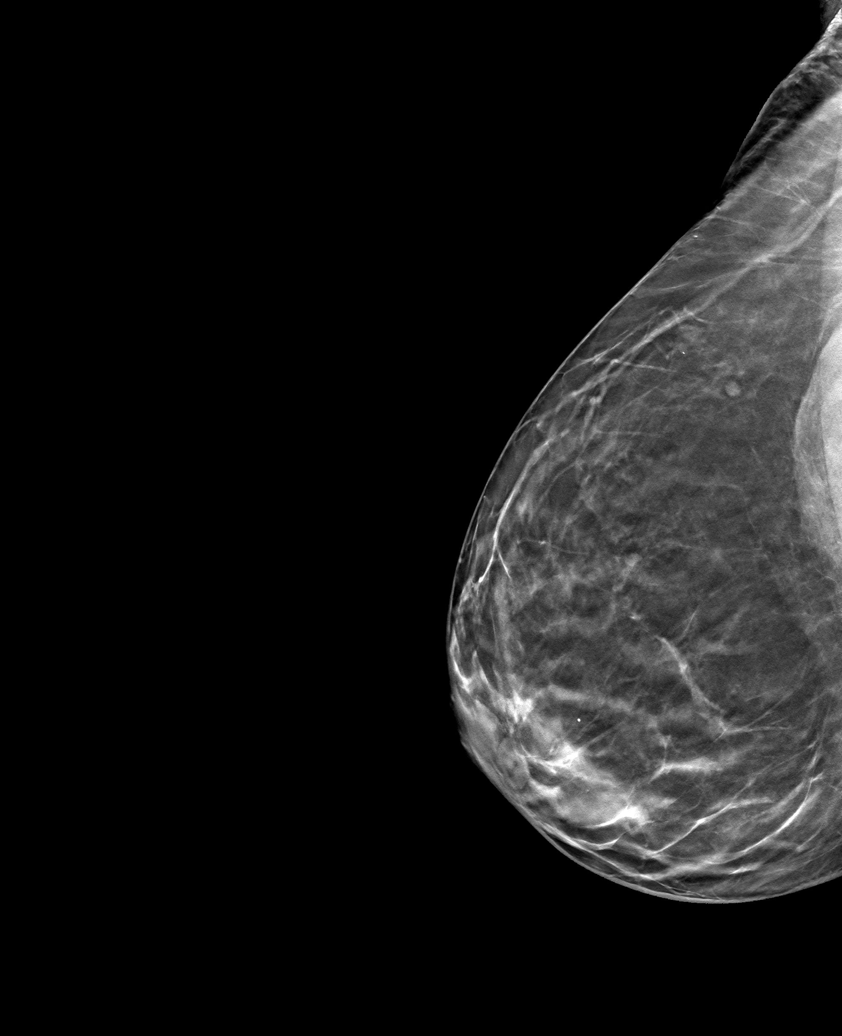

[R MLO tomo · tomo slice 29/56.0]
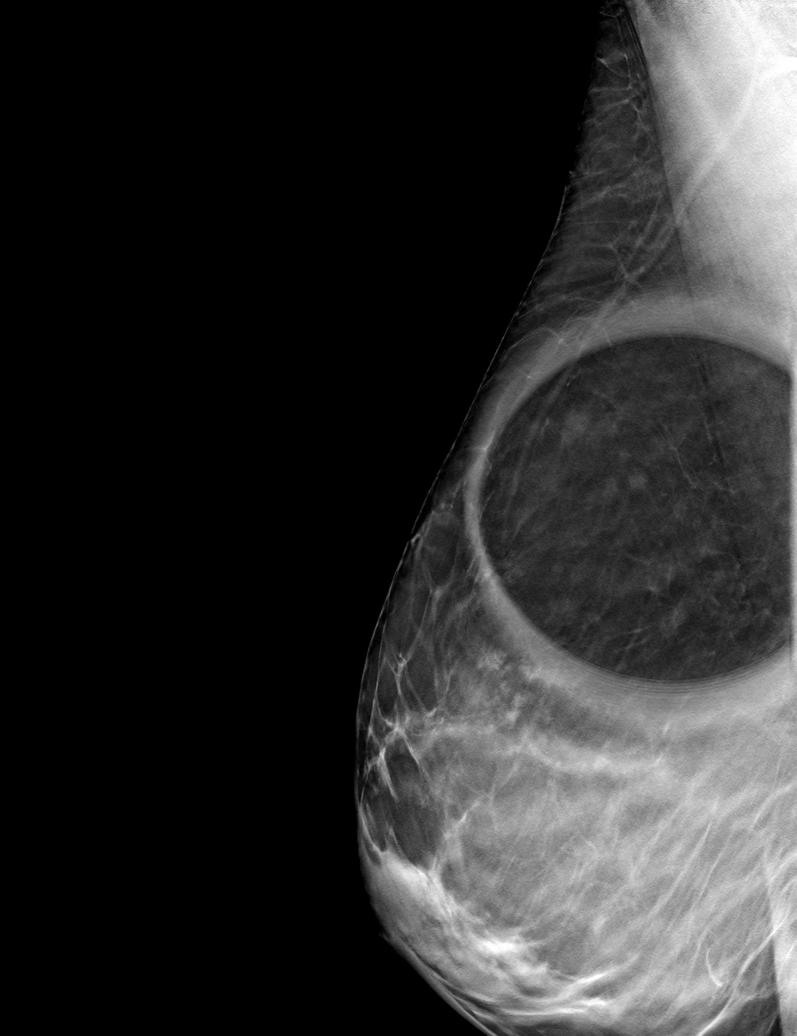

[6 of 18 positions shown; findings below may reference images not displayed]

ACR Breast Density Category b: There are scattered areas of
fibroglandular density.
FINDINGS: Additional 2-D and 3-D images are performed. These views confirm
presence of a circumscribed oval mass with lucent center in the
UPPER-OUTER QUADRANT of the LEFT breast. In this region, there is
low density fibroglandular tissue not associated with masslike
density or distortion.

Mammographic images were processed with CAD.

On physical exam, I palpate no abnormality in the UPPER-OUTER
QUADRANT of the RIGHT breast.

Targeted ultrasound is performed, showing small intramammary lymph
node in the 10 o'clock location of the RIGHT breast 10 centimeters
from the nipple which measures 0.3 centimeters. An adjacent area of
fibroglandular tissue and small cysts are present. There is no
suspicious mass or acoustic shadowing.
IMPRESSION: Benign findings in the UPPER-OUTER QUADRANT of the RIGHT breast. No
mammographic or ultrasound evidence for malignancy.

RECOMMENDATION:
Screening mammogram in one year.(Code:[id number])

I have discussed the findings and recommendations with the patient
and her husband. Results were also provided in writing at the
conclusion of the visit. If applicable, a reminder letter will be
sent to the patient regarding the next appointment.

BI-RADS CATEGORY  1: Negative.

## 2019-10-16 ENCOUNTER — Ambulatory Visit

## 2019-10-16 ENCOUNTER — Ambulatory Visit: Admitting: Physician Assistant

## 2019-10-16 LAB — HX COVID19 BY PCR (LGH)
CASE NUMBER: 2021237003190
HX COVID19 BY PCR: NOT DETECTED

## 2020-02-03 ENCOUNTER — Ambulatory Visit: Admitting: Internal Medicine

## 2020-02-04 LAB — HX COVID19 BY PCR (LGH)
CASE NUMBER: 2021347003175
HX COVID19 BY PCR: NOT DETECTED

## 2020-02-07 ENCOUNTER — Ambulatory Visit

## 2020-02-08 ENCOUNTER — Ambulatory Visit: Admitting: Family Medicine

## 2020-02-08 LAB — HX COVID19 BY PCR (LGH)
CASE NUMBER: 2021352001534
HX COVID19 BY PCR: NOT DETECTED

## 2020-04-02 ENCOUNTER — Ambulatory Visit: Admitting: Family Medicine

## 2020-04-10 ENCOUNTER — Ambulatory Visit: Admitting: Family Medicine

## 2020-04-10 ENCOUNTER — Ambulatory Visit (HOSPITAL_BASED_OUTPATIENT_CLINIC_OR_DEPARTMENT_OTHER)

## 2020-04-10 ENCOUNTER — Ambulatory Visit

## 2020-05-19 NOTE — Progress Notes (Signed)
* * *         [facility], [facility]**        ---    Lawernce Ion. Sindy Messing, MD [facility] Kieth Brightly. Donnetta Simpers, MD [facility] Barbera Setters Byrd Hesselbach, MD  [facility];    Darlyn Chamber, MD [facility] Roosvelt Maser, MD [facility] Shanon Brow, MD [facility]  Nile Dear. Audley Hose, MD [facility]    Kandis Mannan A. Karie Mainland, MD [facility] Johnell Comings. MacNaught, MD [facility] Mitchel Honour, MD Erma Pinto, MD [facility]    Colletta [place], MD Weston Anna, MD [facility] Kerby Moors, NP Maximino Greenland ,  NP    Tonna Corner, NP Danelle Earthly, NP        * * *     **Patient Name:** Sierra Sierra Nichols [facility]   **Date:** 06/20/2017    --- ---     **DOB:** 12/03/1975     **Referring Provider:** Milinda Hirschfeld, MD   **Appointment Provider:**  [name], MD        * * *    06/20/2017  Progress Notes: [name], MD    --- ---    ---        Current Medications    ---    Taking     * B-12     ---    * multivitamin     ---    * calcium citrate     ---    * Medication List reviewed and reconciled with the patient    ---      Past Medical History    ---      None    ---      Family History    ---      Father: deceased    ---    Mother: alive    ---    Father : ICH - died 50s\n\n(-) premature CAD    (-) DM HTN.    ---      Social History    ---    no Tobacco .    no Alcohol.   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    ---      Allergies    ---      N.K.D.A.    ---        Reason for Appointment    ---      1\. Weight mgmt calling to schedule, pre-op gastric bypass;re-entered did  not cross over TO [facility]    ---      History of Present Illness    ---     _History of Present Illness_ :    sleeve shen    (-) OSA    sister bariatirc surgery next monday    changed diet 10 lb    Not mucha avtovty    chinese food swelling    no pregnancy issue    (-) HTN.      Vital Signs    ---    HR 68, BP 110/62, Wt 300, Ht 5'5'', BMI 49.92, Med Assist: em.      Data    ---     _EKG (reviewed personally)_ :    NSR borderline 1 rst degree AVB.    _Reveals_ :    General appearance well developed,  well nourished. HEENT Normocephalic,  atraumatic. Neck exam No jugular venous distension or hepatojugular reflux,  Carotid upstrokes normal without bruits. Chest symetrical  to expansion without  subclavian bruits. Lungs clear to auscultation without rales or wheezing.  Cardiac exam Nondisplaced PMI, regular heart sounds without S3, S4, No  murmurs, rubs, thrills or heaves. Abdominal exam Soft, nontender, nondistended  with normal bowel sounds, No bruits or pulsatile masses. Extremity exam No  edema, clubbing or cyanosis, Pulses are 2+ bilaterally. Neurologic exam  Grossly non-focal motor exam.          Impression/Recommendations    ---       **1\. Morbid (severe) obesity due to excess calories**    Clinical Notes: dictated [facility].    ---        Diagnostic Imaging    --- ---    Ardine Bjork: **EKG_    ---       Procedure Codes    ---      93000 IH    ---      Follow Up    ---    prn    Electronically signed by Kandis Mannan Demetrio Leighty MD on 06/20/2017 at 11:00 AM EDT    Sign off status: Completed        * * *        [facility]    [address]    [place], [place] [address]    Tel: [phone]    Fax: [phone]              * * *          Patient: Sierra, Sierra Nichols DOB: 12-03-1975 Progress Note: [name], MD  06/20/2017    ---    Note generated by eClinicalWorks EMR/PM Software ([email or web address])

## 2020-05-19 NOTE — Progress Notes (Addendum)
* * *        **  Sierra Nichols    --- ---    5 Y old Female, DOB: 08-Jan-1976    [address], [place], [place] [address]    Home: [phone]    Provider: Carrolyn Meiers        * * *    Telephone Encounter    ---    Answered by   Carrolyn Meiers  Date: 12/09/2017         Time: 05:33 AM    Reason   lab results, sms sent    --- ---            Action Taken                      Lyn Joens  12/09/2017 5:39:05 AM > sms sent      Denaja Verhoeven  12/09/2017 5:41:15 AM > webmail also sent                    * * *         **eMessages**   From:   Severin Bou    --- ---    Created:   2017-12-09 05:41:02    Sent:      Subject:   RE:lab results, sms sent    Message:                      Kenzly Rogoff  12/09/2017 5:39:05 AM > sms sent    Hi Boneta Lucks, I am pleased to inform you that your lab results showed normal CBC,  kidney, electrolytes, liver, thyroid, Diabetes screen, Vitamin B12, Vitamin D  and folic acid levels. Fasting blood sugar is slightly elevated but not in the  diabetic range. Your cholesterol showed slightly elevation of LDL. I encourage  you to continue with agressive lifestyle modification. No medication is needed  at this time. Please call our office should you have any questions. Regards,  Dr. Christella Scheuermann                        ---          * * *          Patient: Sierra Nichols, Sierra Nichols DOB: [birth year] Provider: Carrolyn Meiers 12/09/2017    ---    Note generated by eClinicalWorks EMR/PM Software ([email or web address])

## 2020-05-19 NOTE — Progress Notes (Addendum)
---    **Progress Notes**    ---    **Patient:** Sierra Nichols   **Provider:** Montoya Brandel R. Christella Scheuermann, M.D.     **DOB:** [birth year] **Age:** 45 Y **Sex:** Female   **Date:** 12/06/2017             * * *        ---         **Reason for Appointment**    ---       1\. CODER NOTES: Per Cardio 06/20/17 review E66.01 (morbid severe obesity  due to excess calories). If patient's BMI is over 40 code appropriate BMI code  Z68.4X.    ---    2\. CPE    ---       **History of Present Illness**    ---     _Depression Screening_ :    PHQ-2 (2015 Edition) Little interest or pleasure in doing things? Not at all,  Feeling down, depressed, or hopeless? Not at all, Total Score 0\.    s/p Gastric bypass: surgery, eating about 1000 cal a day, minimal carbs, some  protein, with protein drink, with good supply of fruits and vegetables.       **Current Medications**    ---    Taking     * Mirena     ---    * Omeprazole 10 MG Capsule Delayed Release 1 capsule Orally Once a day, Notes: unsure of dosage    ---    * Vitamin B12 500 MCG Tablet as directed Orally     ---    * Calcium 500 MG Tablet 4 tablet with meals Orally Twice a day    ---    * Multivitamin Adult - Tablet as directed Orally     ---    * Biotin 10 MG Tablet 1 tablet Orally Once a day    ---    * Vitamin B Complex-C 1 tab each morning on empty stomach and 1 1/2 tabs on sunday Orally daily    ---    * Medication List reviewed and reconciled with the patient    ---       **Past Medical History**    ---       Cholecystitis.        ---    Contraception, placed Feb 2018( 2nd).        ---    Error of refraction.        ---    BMI> 40.        ---    Mammogram at 8 was normal.        ---    Family hx of Maternal GM with breast cancer postmenopausal(double mastectomy).        ---    Last pap was 03/2016, was normal, [facility], due for appt in Dec.        ---    Lipoma, right posterior chest.        ---    Last eye exam, Jan 2019.        ---    Hep B immunized, one shot from [facility], 2nd shot given 2018.        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    ---    Maternal Grand Mother: deceased 71 yrs, breast cancer, in her late 53's  postmenopausal, Cancer    ---    Maternal uncle: alive 37 yrs, kidney, lung and brain cancers    ---    1 sister(s) - healthy. 2daughter(s) - healthy.    ---    2 daughters are healthy 9 and 29.    ---       **Social History**    ---    Children: daughter(s) 2.    Sun Screen: uses daily.    Exercise: walking, yard work and light weight..    Seat belts: always.    Mammogram: current.    Alcohol alcohol occasional 2x a week, beer or wine.    Sexually active: significant other only.    Pets: dog.    Marital Status: single.    Marijuana: no.    Diet: no specific.    Education: BS/BA in [place], for 1 yr.    Living with: with boyfriend( not father of the kids), mom and kids.    Guns in home: none.    Tobacco Use Status: never smoker, Patient counseled on cessation: 12/06/2017.    Occupation: Print production planner, [employer].    Domestic violence: none.    Feels safe in home: yes.      **Gyn History**    ---    LMP irregular, since after surgery, relatively heavy..      **OB History**    ---    Total living children 2\.    Pregnancy # 1: NSVD.    Pregnancy # 2: NSVD.      **Allergies**    ---       N.K.D.A.    ---       **Hospitalization/Major Diagnostic Procedure**    ---       as above    ---       **Vital Signs**    ---    Temp 98.9, Weight Change -64.6 lb, BP 114/68, HR 76, Ht 66, Wt 243.4, BMI  39.28, RR 16, Oxygen sat % 96.       **Examination**    ---     _General Examination_ :    GENERAL APPEARANCE: well developed and well nourished, mildly overweight.  HEENT: Head - NC/AT, EOMI, PERRL,clear conjunctivae, anicteric,pharynx and  tonsils normal, no lymphadenopathies, no carotid bruits, no jugular venous  distention.Marland Kitchen FACE:  No asymmetry. NECK: supple, no lymphadenopathy. HEART:  RSR/RRR, normal S1S2, no murmurs, no heaves or thrills, PMI at fifth  intercostal space left midclavicular line.. LUNGS: good air entry bilaterally,  clear to auscultation, comfortable and easy respirations.Marland Kitchen BREASTS:  symmetrical, no palpable masses bilaterally, no lumps felt on either side, no  nipple retraction, no nipple drainage, no axillary lymphadenopathy. ABDOMEN:  normoactive BS, soft,  NT/ND, BS present, no hepatosplenomegaly. BACK:  unremarkable, normal, normal range of motion of spine, no paraspinal  tenderness, normal alignment, no CVA tenderness, no tenderness. EXTREMITIES:  no deformities. NEUROLOGIC EXAM:  Grossly nonfocal. SKIN: normal, no rash, a  few freckles especially on upper shoulders and back. FEMALE GENITOURINARY:  Deferred to GYN. PSYCHOLOGICAL:  euthymic mood and affect, normal speech,  oriented x 3 , no thought disorder , no SI/HI, no flight of ideas.          **Assessments**    ---    1\. Encounter for general adult medical examination with abnormal findings -  Z00.01 (Primary)    ---    2\. Influenza vaccine administered - Z23    ---    3\. Constipation, unspecified constipation type - K59.00    ---    4\. Irregular bleeding - N92.6    ---    5\. BMI 39.0-39.9,adult - Z68.39    ---    6\. S/P gastric bypass - Z98.84    ---    7\. Contraception, device intrauterine - Z97.5    ---       **Treatment**    ---       **1\. Encounter for general adult medical examination with abnormal  findings**     _LAB: CBC w/ Indices_ Normal   WBC  7.8    3.7-11.2 - thous/mm3    --- --- --- ---    RBC  4.72    3.90-5.20 - Mil/mm3    --- --- --- ---    Hgb  12.9    11.8-16.0 - Gm/dL    --- --- --- ---    Hct  40.3    36.0-47.0 - %    --- --- --- ---    MCV  85.4    80.0-100.0 - fL    --- --- --- ---    MCH  27.3    26.0-34.0 - pGm    --- --- --- ---    MCHC  32.0    31.0-37.0 - Gm/dL    --- --- --- ---    Platelet  208    150-400 - thous/mm3    --- --- --- ---    RDW-SD  46.3    35.0-51.0 - fL    --- --- --- ---    MPV  13.0  H  9.4-12.4 - fL    --- --- --- ---    _LAB: Comprehensive Metabolic Panel_ Normal Creatinine  0.605    0.550-1.300 -  mg/dL    --- --- --- ---    Sodium Lvl  139    136-146 - mmol/L    --- --- --- ---    Potassium Lvl  3.7    3.6-5.2 - mmol/L    --- --- --- ---    Chloride  106    98-110 - mmol/L    --- --- --- ---    CO2  25    21-32 - mmol/L    --- --- --- ---    Total Protein  7.1    6.0-8.4 - Gm/dL    --- --- --- ---    Albumin Lvl  3.7    3.2-5.0 - Gm/dL    --- --- --- ---    Calcium Lvl  9.0    8.5-10.5 - mg/dL    --- --- --- ---    Glucose Lvl  108    70-110 - mg/dL    --- --- --- ---  Bili Total  0.4    0.2-1.2 - mg/dL    --- --- --- ---    Alk Phos  92    30-117 - Units/L    --- --- --- ---    AST  15    6-40 - Units/L    --- --- --- ---    ALT  28    6-55 - Units/L    --- --- --- ---    Anion Gap  8    3-11 -    --- --- --- ---    BUN  11    6-20 - mg/dL    --- --- --- ---    _LAB: Lipid Panel_ sl high LDL-141, otherwise normal Chol  203  H  <=200 -  mg/dL    --- --- --- ---    HDL  45    >=40 - mg/dL    --- --- --- ---    Trig  87    <=150 - mg/dL    --- --- --- ---    LDL  141  H  <=130 - mg/dL    --- --- --- ---    Status  Done     \-    --- --- --- ---        _Imaging: Cayey Tomos[employer] Breast Screening e-sch*_    Notes: healthy lifestyle habits including consistent exercise, moderation in  alcohol if any, wearing seatbelts and sunblock advised, Tdap updated, annual  flu shot  recommended in the fall, and received today.Reinforced routine GYN  visits.        **2\. Constipation, unspecified constipation type**    Notes: Fiber in fluid intake, fairly sufficient patient admits it is  manageable with dietary modification.        **3\. Irregular bleeding**    _LAB: TSH Reflex Panel (Recommended)_ Normal   3rd Gen TSH  1.710     0.358-3.740 - mclU/ml    --- --- --- ---        Notes: On and off, patient willing to wait till she sees the gynecologist at  [facility].        **4\. BMI 39.0-39.9,adult**    _LAB: Hemoglobin A1c_ Normal   Mean Bld Glucos  100     \- mg/dL    --- --- --- ---    Glycated Hgb  5.1    <=5.6 - %    --- --- --- ---        Notes: Vigilant with her diet and exercise, has already shed off close to 60  pounds since her gastric bypass surgery continue to monitor.        **5\. S/P gastric bypass**    Continue Omeprazole Capsule Delayed Release, 10 MG, 1 capsule, Orally, Once a  day, Notes: unsure of dosage    Continue Vitamin B12 Tablet, 500 MCG, as directed, Orally    Continue Multivitamin Adult Tablet, -, as directed, Orally    Continue Biotin Tablet, 10 MG, 1 tablet, Orally, Once a day    Continue Vitamin B Complex-C, 1 tab each morning on empty stomach and 1 1/2  tabs on sunday, Orally, daily    _LAB: Folate Level_ Normal   Folate Lvl  >20.0    >=5.4 - nGm/ml    --- --- --- ---    _LAB: Vitamin B12 Level_ Normal Vitamin B12 Lvl  468    193-986 - pg/mL    --- --- --- ---    _LAB: Vitamin D  25 Hydroxy Level_ Normal Vitamin D 25 OH Lvl  35    30-100 -  nGm/ml    --- --- --- ---        Notes: Reinforced follow-up with the surgeon for a few months to 1-2 years and  once is stable may be transitioned to me or PCP. Continue above medications  for nutritional supplementation.        **6\. Contraception, device intrauterine**    Continue Mirena    Notes: Surveillance as per GYN.      **Immunization**    ---       INFLUENZA OFFICE SUPPLIED 18+ : 0.5 mL (Route: Intramuscular) given  by  Orlando Penner on Left Deltoid (Influenza vaccine administered)    ---       **Visit Codes**    ---       94854 PREV MED VISIT EST PT 40-64 YR.    ---      **Procedure Codes**    ---       62703 INFLUENZA OFFICE SUPPLIED 18+    ---    50093 IMMUNIZATION ADMIN    ---       **Follow Up**    ---    1 Year, prn    Electronically signed by Carrolyn Meiers , MD on 12/09/2017 at 09:40 AM EDT    Sign off status: Completed          * * *      Patient: Sierra Nichols   Provider: Beckie Busing. Christella Scheuermann, M.D.    --- ---    DOB: [birth year]  Date: 12/06/2017    Note generated by eClinicalWorks EMR/PM Software ([email or web address])    ---

## 2020-05-19 NOTE — Progress Notes (Addendum)
* * *        **  [facility], Cheri Guppy    --- ---    63 Y old Female, DOB: 01/25/1976    [address], [place], [place] [address]    Home: [phone]    Provider: Carrolyn Meiers, MD        * * *    Telephone Encounter    ---    Answered by   Carrolyn Meiers  Date: 01/17/2017         Time: 06:51 PM    Reason   old records fr [facility]    --- ---            Message                      Records show she only got one dose of Hep B.  Pls have her schedule for 2 more doses( at least 4 months apart). thanks.                Action Taken   Veatrice Bourbon [facility] 82/95/6213 8:52:50 AM > Left message for  patient to call and schedule appointment                * * *                ---          * * *          Patient: Sierra Nichols, Sierra Nichols DOB: 11/03/1975 Provider: Carrolyn Meiers, MD  01/17/2017    ---    Note generated by eClinicalWorks EMR/PM Software ([email or web address])

## 2020-05-19 NOTE — Progress Notes (Addendum)
---    **Progress Notes**    ---    **Patient:** Sierra Nichols   **Provider:** Setsuko Robins R. Christella Scheuermann, M.D.     **DOB:** 12-18-1975 **Age:** 45 Y **Sex:** Female   **Date:** 12/05/2016             * * *        ---         **Reason for Appointment**    ---       1\. Physical, adult; np    ---       **History of Present Illness**    ---     _General_ :    old pcp was from [place].       **Current Medications**    ---    Taking       * Mirena         ---          * Medication List reviewed and reconciled with the patient        ---       **Past Medical History**    ---       Cholecystitis    ---    Contraception, placed Feb 2018( 2nd)    ---    Error of refraction    ---    BMI> 40    ---    Mammogram at 38 was normal    ---    Family hx of Maternal GM with breast cancer postmenopausal(double mastectomy)    ---    Last pap was 03/2016, was normal, [facility]    ---    Lipoma, right posterior chest    ---       **Surgical History**    ---       Laparoscopic surgery, for GB 07/2016    ---    Colposcopy for HPV positive    ---      **Family History**    ---       Father: deceased 12 yrs, Brain aneurysm, died in 61    ---    Mother: alive 44 yrs, On medication, diagnosed with Hypertension    ---    Sister: alive 3 yrs, healthy    ---    Paternal Grand Father: deceased 57 yrs, possibly heart attack,    ---    Paternal Grand Mother: deceased 59 yrs, 75, died in her sleep    ---    Maternal Grand Father: deceased, COPD, congestive heart failure, 44 yrs old.    ---    Maternal Grand Mother: deceased, breast cancer, in her late 61's  postmenopausal, diagnosed with Cancer    ---    1 sister(s) - healthy. 2daughter(s) - healthy.    ---    2 daughters are healthy 8 and 71.    ---       **Social History**    ---    Diet: no specific.    Marijuana: no.    Seat belts: always.    Sun Screen: uses daily.    Guns in home: none.    Feels safe in home: yes.    Domestic violence: none.    Mammogram: current.    Alcohol alcohol  occasional 2x a week, beer or wine.    Children: daughter(s) 2.    Education: BS/BA in [place], for 1 yr.    Exercise: walking.    Living with: with boyfriend( not father of the kids), mom and kids.  Marital Status: single.    Occupation: Print production planner.    Pets: dog.    Sexually active: significant other only.    Tobacco Use Status: never smoker, Patient counseled on cessation: 12/05/2016.      **OB History**    ---    Total living children 2\.    Pregnancy # 1: NSVD.    Pregnancy # 2: NSVD.      **Allergies**    ---       N.K.D.A.    ---       **Hospitalization/Major Diagnostic Procedure**    ---       as above    ---      **Vital Signs**    ---    Temp 98.5, BP 107/62, HR 109, Ht 66, Wt 308, BMI 49.71, RR 18.       **Examination**    ---     _General Examination_ :    GENERAL APPEARANCE: well developed and well nourished, fairly developed,  moderately overweight. HEENT: Head - NC/AT, EOMI, PERRLA, nose clear,  turbinates normal, pharynx and tonsils normal. OROPHARYNX: normal dentition.  NECK: supple, no lymphadenopathy, no carotid bruits noted, no thyromegaly, no  jugular distension, no lymphadenopathy.Marland Kitchen LYMPH NODES: No palpable adenopathy.  HEART: RSR/RRR, normal S1S2, no murmurs, PMI at 5th intercostal space, left  midclavicular line. LUNGS: good air entry bilaterally, clear to auscultation  and percussion, equal breath sounds bilaterally, no rales, wheeze or rhonchi.  ABDOMEN: normoactive BS, soft, NT/ND, BS present, no masses palpated, no  hepatosplenomegaly, no costovertebral tenderness.Marland Kitchen EXTREMITIES: sensations  normal, symmetric strength and reflexes-no clubbing, no edema, no cyanosis.  NEUROLOGIC EXAM: no focal signs. SKIN: normal, no rash. PERIPHERAL PULSES:  normal (2+) bilaterally, x 4 extremities .          **Assessments**    ---    1\. Encounter for general adult medical examination without abnormal findings  - Z00.00 (Primary)    ---    2\. Encounter for immunization - Z23, tdap, flu shot    ---     3\. Body mass index (BMI) of 40.1-44.9 in adult - Z68.41    ---    4\. Family history of breast cancer - Z80.3    ---       **Treatment**    ---       **1\. Encounter for general adult medical examination without abnormal  findings**    _LAB: CBC/DIFF - CBCD_    _LAB: COMP METABOLIC PANEL RANDOM - CMP_    _LAB: Lipid Panel_    _LAB: Urinalysis (Complete)_    _Imaging: [facility] Digital Mammogram Screening Bilat e-sch*_    Notes: healthy lifestyle habits including consistent exercise, moderation in  alcohol if any, wearing seatbelts and sunblock advised, Tdap updated, annual  flu shot recommended in the fall and received today.    ---        **2\. Body mass index (BMI) of 40.1-44.9 in adult**    _LAB: 3 Gen Sensitive TSH_    _LAB: VITAMIN-D 25-HYDROXY - VITD25_    Notes: Considering surgical weight loss>>referral to [facility] wellness clinic,  emphasized to pt that the most enduring weight loss regimen is being  consistent with diet and program of exercises if any, but the surgical weight  loss program is definitely help make the jumpstart to start with more  accelerated weight loss. Pt agrees to comply with classess and regimen  recommended.        **3\. Family history of breast  cancer**    _Imaging: [facility] Digital Mammogram Screening Bilat e-sch*_    Notes: screening as above.      **Immunization**    ---       TDAP - OFFICE SUPPLIED : 0.5 given by Gerrit Halls LPN on Left Deltoid    ---    INFLUENZA OFFICE SUPPLIED 18+ : 0.5 mL given by Gerrit Halls LPN on Right  Deltoid    ---       **Procedure Codes**    ---       40347 TDAP - OFFICE SUPPLIED    ---    90471 Single or Combination    ---    42595 FLU VACC 4 VAL 3 YRS PLUS IM    ---       **Follow Up**    ---    6 Months, prn    Electronically signed by Carrolyn Meiers MD, MD on 12/09/2016 at 04:29 PM EDT    Sign off status: Completed          * * *      Patient: Sierra Nichols   Provider: Beckie Busing. Christella Scheuermann, M.D.    --- ---    DOB: 1975/10/16  Date: 12/05/2016    Note generated by  eClinicalWorks EMR/PM Software ([email or web address])    ---

## 2020-05-19 NOTE — Progress Notes (Addendum)
* * *        **  [facility]    --- ---    29 Y old Female, DOB: 06/05/1975    [address], [place], [place] [address]    Home: [phone]    Provider: Carrolyn Meiers, MD        * * *    Telephone Encounter    ---    Answered by   Carrolyn Meiers  Date: 01/30/2017         Time: 06:07 PM    Reason   labs    --- ---            Message                      Pls tell her that her labs are ok, except the  LDL cholesterol( bad cholesterol) is sl high and recommend healthy eating habits and exercise.            Vitamin D(23) is sl low, so I will send vitamin d supplement to the pharmacy.                 Action Taken   Veatrice Bourbon [facility] 82/95/6213 2:31:00 PM > Left message to  return call. Veatrice Bourbon [facility] 08/65/7846 2:39:11 PM > Pt aware            Refills  Start Cholecalciferol Capsule, 1000 UNIT, Orally, 30, 1 capsule, Once  a day, 30 day(s), Refills=5    --- ---          * * *                ---          * * *          PatientFarrel Gordon Nichols DOB: 06/05/1975 Provider: Carrolyn Meiers, MD  01/30/2017    ---    Note generated by eClinicalWorks EMR/PM Software ([email or web address])

## 2020-06-08 ENCOUNTER — Encounter (HOSPITAL_BASED_OUTPATIENT_CLINIC_OR_DEPARTMENT_OTHER): Admitting: Family Medicine

## 2020-06-29 ENCOUNTER — Encounter (INDEPENDENT_AMBULATORY_CARE_PROVIDER_SITE_OTHER): Admitting: Family Medicine

## 2020-06-29 ENCOUNTER — Encounter

## 2020-07-02 ENCOUNTER — Other Ambulatory Visit (HOSPITAL_BASED_OUTPATIENT_CLINIC_OR_DEPARTMENT_OTHER): Admitting: Family Medicine

## 2020-07-10 ENCOUNTER — Ambulatory Visit (HOSPITAL_BASED_OUTPATIENT_CLINIC_OR_DEPARTMENT_OTHER)

## 2020-11-23 ENCOUNTER — Ambulatory Visit (INDEPENDENT_AMBULATORY_CARE_PROVIDER_SITE_OTHER): Admitting: Family Medicine

## 2020-11-23 ENCOUNTER — Encounter (INDEPENDENT_AMBULATORY_CARE_PROVIDER_SITE_OTHER): Admitting: Family Medicine

## 2020-11-23 ENCOUNTER — Other Ambulatory Visit

## 2020-11-23 ENCOUNTER — Ambulatory Visit: Attending: Family Medicine | Admitting: Family Medicine

## 2020-11-23 VITALS — BP 134/90 | HR 56 | Temp 97.3°F | Resp 16 | Wt 243.0 lb

## 2020-11-23 DIAGNOSIS — N3000 Acute cystitis without hematuria: Secondary | ICD-10-CM

## 2020-11-23 LAB — URINALYSIS REFLEX TO CULTURE
Bilirubin, Ur: NEGATIVE
Blood, Ur: NEGATIVE
Glucose,Ur: NEGATIVE mg/dL
Ketones, Ur: NEGATIVE mg/dL
Leukocyte Esterase, Ur: NEGATIVE WBC/uL
Nitrite, Ur: NEGATIVE
Protein,Ur: NEGATIVE mg/dL
Specific Gravity, Ur: 1.01 (ref 1.003–1.030)
Urobilinogen, Ur: NEGATIVE
pH, Ur: 7 (ref 5.0–8.0)

## 2020-11-23 MED ORDER — nitrofurantoin, macrocrystal-monohydrate, (Macrobid) 100 mg capsule
100 | ORAL_CAPSULE | Freq: Two times a day (BID) | ORAL | 0 refills | 6.00000 days | Status: AC
Start: 2020-11-23 — End: 2020-11-30

## 2020-11-23 NOTE — Result Quicknote (Signed)
 Please tell patient that her urinalysis was relatively clean, no evidence of urinary tract infection, she may stop her antibiotics after 3 days.  Continue to drink lots of fluids.

## 2020-11-23 NOTE — Progress Notes (Signed)
 [facility]  [facility]  [address]  [address]  [place] [address]  Dept: [phone]  Dept Fax: [phone]     Patient ID: Sierra Nichols is a 45 y.o. female who presents for UTI.    Subjective   UTI:  She has urgency, right flank pain since about 3 days, mild nausea, no vomiting, no hematuria.  No fever, no constipation.  She denies any history of kidney stones, no history of recurrent UTIs.    LMP: none on Mirena( due to be out in 3 yrs).  Sexually acitive      Patient Active Problem List   Diagnosis   . Cholecystitis   . BMI 39.0-39.9,adult   . Morbid obesity with BMI of 40.0-44.9, adult (CMS/HCC)   . Constipation   . Elevated blood pressure reading without diagnosis of hypertension   . Irregular menstrual cycle   . Lipoma of left axilla   . Low back pain radiating to left leg   . 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 S/P gastrectomy       Objective   Visit Vitals  BP (!) 134/90 (BP Location: Left arm, Patient Position: Sitting, BP Cuff Size: Large adult)   Pulse 56   Temp 36.3 C (97.3 F) (Temporal)   Resp 16   Wt 110 kg   SpO2 99%   BMI 39.22 kg/m   OB Status Implant   BSA 2.26 m       Physical Exam  Constitutional:       General: She is not in acute distress.     Appearance: She is not toxic-appearing.   HENT:      Head: Normocephalic and atraumatic.      Nose: Nose normal.      Mouth/Throat:      Mouth: Mucous membranes are moist.   Eyes:      Extraocular Movements: Extraocular movements intact.      Conjunctiva/sclera: Conjunctivae normal.   Cardiovascular:      Rate and Rhythm: Normal rate and regular rhythm.      Pulses: Normal pulses.   Pulmonary:      Effort: Pulmonary effort is normal.      Breath sounds: Normal breath sounds.   Abdominal:      General: Bowel sounds are normal. There is no distension.      Palpations: Abdomen is soft. There is no mass.      Tenderness: There is no abdominal tenderness. There is no rebound.      Hernia: No hernia is  present.      Comments: CVAT bilateralNegative    Musculoskeletal:         General: Normal range of motion.      Cervical back: Neck supple.   Skin:     General: Skin is warm.   Neurological:      General: No focal deficit present.      Mental Status: She is alert and oriented to person, place, and time.   Psychiatric:         Mood and Affect: Mood normal.         Behavior: Behavior normal.         Thought Content: Thought content normal.         Assessment/Plan   Kevionna was seen today for uti.  Acute cystitis without hematuria  -     Urinalysis reflex to culture; Future  -     nitrofurantoin, macrocrystal-monohydrate, (  Macrobid) 100 mg capsule; Take 1 capsule (100 mg) by mouth in the morning and at bedtime for 7 days.  -     Urinalysis reflex to culture  Reinforced generous fluid intake, perineal hygiene and bowel/stool conditioning. Pt will be notified with results accordingly.   Elevated blood pressure reading without diagnosis of hypertension  Comments:  recheck in 2-3  Months, monitor as outpatient.  Reinforced cutting down on the salt and mild to moderate exercises.  Need for vaccination  Comments:  flu shot today  Orders:  -     Flu vaccine (IIV4) greater than or equal to 6 months old, preservative free

## 2020-11-25 ENCOUNTER — Encounter (INDEPENDENT_AMBULATORY_CARE_PROVIDER_SITE_OTHER): Admitting: Family Medicine

## 2020-12-04 ENCOUNTER — Telehealth (INDEPENDENT_AMBULATORY_CARE_PROVIDER_SITE_OTHER)

## 2020-12-04 NOTE — Telephone Encounter (Signed)
-----   Message from Carrolyn Meiers, MD sent at 11/24/2020 10:35 PM EDT -----  Please tell patient that her urinalysis was relatively clean, no evidence of urinary tract infection, she may stop her antibiotics after 3 days.  Continue to drink lots of fluids.

## 2020-12-04 NOTE — Telephone Encounter (Signed)
 LVM to please cb

## 2020-12-14 ENCOUNTER — Encounter (INDEPENDENT_AMBULATORY_CARE_PROVIDER_SITE_OTHER): Admitting: Family Medicine

## 2020-12-24 ENCOUNTER — Other Ambulatory Visit (HOSPITAL_BASED_OUTPATIENT_CLINIC_OR_DEPARTMENT_OTHER): Admitting: Family Medicine

## 2020-12-24 NOTE — Telephone Encounter (Signed)
 LVM for pt to please call me. I'm calling to see how she is sine the UTI.

## 2021-02-12 ENCOUNTER — Ambulatory Visit (HOSPITAL_BASED_OUTPATIENT_CLINIC_OR_DEPARTMENT_OTHER): Admitting: Adult Health

## 2021-04-09 ENCOUNTER — Ambulatory Visit: Payer: BLUE CROSS/BLUE SHIELD | Primary: Family Medicine

## 2021-04-09 DIAGNOSIS — Z1231 Encounter for screening mammogram for malignant neoplasm of breast: Secondary | ICD-10-CM

## 2021-05-17 ENCOUNTER — Ambulatory Visit: Payer: Managed Care, Other (non HMO) | Admitting: Internal Medicine

## 2021-05-17 ENCOUNTER — Encounter: Payer: Self-pay | Admitting: Internal Medicine

## 2021-05-17 VITALS — BP 122/78 | HR 71 | Ht 67.0 in | Wt 162.8 lb

## 2021-05-17 DIAGNOSIS — R109 Unspecified abdominal pain: Secondary | ICD-10-CM | POA: Diagnosis not present

## 2021-05-17 DIAGNOSIS — R198 Other specified symptoms and signs involving the digestive system and abdomen: Secondary | ICD-10-CM | POA: Diagnosis not present

## 2021-05-17 DIAGNOSIS — Z1211 Encounter for screening for malignant neoplasm of colon: Secondary | ICD-10-CM

## 2021-05-17 DIAGNOSIS — K219 Gastro-esophageal reflux disease without esophagitis: Secondary | ICD-10-CM

## 2021-05-17 DIAGNOSIS — K649 Unspecified hemorrhoids: Secondary | ICD-10-CM

## 2021-05-17 MED ORDER — DICYCLOMINE HCL 10 MG PO CAPS: 10.0000 mg | ORAL_CAPSULE | Freq: Three times a day (TID) | ORAL | 2 refills | Status: DC | PRN

## 2021-05-17 MED ORDER — BENEFIBER PO POWD: 1.0000 | Freq: Every day | ORAL | 0 refills | Status: AC | PRN

## 2021-05-17 MED ORDER — DICYCLOMINE HCL 10 MG PO CAPS: 10.0000 mg | ORAL_CAPSULE | Freq: Three times a day (TID) | ORAL | 2 refills | Status: AC | PRN

## 2021-05-17 NOTE — Patient Instructions (Signed)
You have been scheduled for a colonoscopy. Please follow written instructions given to you at your visit today.  ?Please pick up your prep supplies at the pharmacy within the next 1-3 days. ?If you use inhalers (even only as needed), please bring them with you on the day of your procedure.  ? ?We have sent the following medications to your pharmacy for you to pick up at your convenience: Dicyclomine  ? ?Low FOD MAP diet  ? ?Use daily fiber/ Benefiber  ? ?If you are age 13 or older, your body mass index should be between 23-30. Your Body mass index is 25.5 kg/m?Marland Kitchen If this is out of the aforementioned range listed, please consider follow up with your Primary Care Provider. ? ?If you are age 67 or younger, your body mass index should be between 19-25. Your Body mass index is 25.5 kg/m?Marland Kitchen If this is out of the aformentioned range listed, please consider follow up with your Primary Care Provider.  ? ?________________________________________________________ ? ?The [facility] providers would like to encourage you to use [facility] to communicate with providers for non-urgent requests or questions.  Due to long hold times on the telephone, sending your provider a message by [facility] may be a faster and more efficient way to get a response.  Please allow 48 business hours for a response.  Please remember that this is for non-urgent requests.  ?_______________________________________________________  ? ?Due to recent changes in healthcare laws, you may see the results of your imaging and laboratory studies on MyChart before your provider has had a chance to review them.  We understand that in some cases there may be results that are confusing or concerning to you. Not all laboratory results come back in the same time frame and the provider may be waiting for multiple results in order to interpret others.  Please give [place] 48 hours in order for your provider to thoroughly review all the results before contacting the office for  clarification of your results.   ? ?Thank you for choosing [facility] ? ?Sonny Masters Dorsey,MD ?

## 2021-05-17 NOTE — Progress Notes (Signed)
? ?Chief Complaint: Diarrhea and colon cancer screening ? 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 ? ?She has longstanding diarrhea for years.  However over time she has noted that her diarrhea has become more forceful.  She wants to understand why her diarrhea become more severe.  She on average has 1 bowel movement per day.  Sometimes she will have some issues with constipation, which are typically followed by these forceful episodes of diarrhea.  She does state that her bowel habits alternate between constipation and diarrhea typically.  She is more constipated while she was pregnant.  She had her gallbladder removed in the past. She has not tried anyting in the past to help with her bowel habits.  Only on occasoin has very scant amounts of bright red bleeding due to hemorrhoids. Denies other types of rectal bleeding. Denies N&V, weight loss, chest burning, and regurgitation. Denies fam hx of GI canacers. She had EGD and colonoscopy performed in 2003-2004 that were normal. They were done due to her irregular bowel habits, which were ultimately attributed to anxiety. She is still menstruating ? ?Wt Readings from Last 3 Encounters:  ?05/17/21 162 lb 12.8 oz (73.8 kg)  ?03/12/12 134 lb (60.8 kg)  ?02/04/11 181 lb (82.1 kg)  ? ?Past Medical History:  ?Diagnosis Date  ? Abnormal Pap smear   ? Anxiety   ? Discoid lupus   ? ?Past Surgical History:  ?Procedure Laterality Date  ? CHOLECYSTECTOMY    ? GYNECOLOGIC CRYOSURGERY    ? ?Family History  ?Problem Relation Age of Onset  ? Birth defects Father   ?     hole in heart  ? Diabetes Father   ? Pernicious anemia Father   ? Drug abuse Father   ?     prescription pain killers  ? Birth defects Sister   ?     hole in heart  ? Other Sister   ?     auto immune disease  ? Anesthesia problems Neg Hx   ? Hypotension Neg Hx   ? Pseudochol deficiency Neg Hx   ? Malignant hyperthermia Neg Hx   ? Colon cancer Neg Hx   ?  Esophageal cancer Neg Hx   ? Stomach cancer Neg Hx   ? Colon polyps Neg Hx   ? ?Social History  ? ?Tobacco Use  ? Smoking status: Former  ?  Packs/day: 1.00  ?  Years: 15.00  ?  Pack years: 15.00  ?  Types: Cigarettes  ?  Quit date: 07/01/2010  ?  Years since quitting: 10.8  ? Smokeless tobacco: Never  ?Vaping Use  ? Vaping Use: Former  ?Substance Use Topics  ? Alcohol use: Yes  ?  Comment: occasionally  ? Drug use: No  ? ?Current Outpatient Medications  ?Medication Sig Dispense Refill  ? ALPRAZolam (XANAX) 0.5 MG tablet Take 1 tablet by mouth as needed.    ? escitalopram (LEXAPRO) 10 MG tablet Take 10 mg by mouth daily.    ? hydroxychloroquine (PLAQUENIL) 200 MG tablet Take 200 mg by mouth daily.    ? ?No current facility-administered medications for this visit.  ? ?Allergies  ?Allergen Reactions  ? Codeine Hives  ? ?Review of Systems: ?All systems reviewed and negative except where noted in HPI.  ? ?Physical Exam: ?BP 122/78   Pulse 71   Ht '5\' 7"'$  (1.702 m)   Wt 162 lb 12.8 oz (73.8 kg)  SpO2 98%   BMI 25.50 kg/m?  ?Constitutional: Pleasant,well-developed, female in no acute distress. ?HEENT: Normocephalic and atraumatic. Conjunctivae are normal. No scleral icterus. ?Cardiovascular: Normal rate, regular rhythm.  ?Pulmonary/chest: Effort normal and breath sounds normal. No wheezing, rales or rhonchi. ?Abdominal: Soft, nondistended, nontender. Bowel sounds active throughout. There are no masses palpable. No hepatomegaly. ?Extremities: No edema ?Neurological: Alert and oriented to person place and time. ?Skin: Skin is warm and dry. No rashes noted. ?Psychiatric: Normal mood and affect. Behavior is normal. ? ?Labs 11/2019: CBC with nml Hb and nml plts. CMP unremarkable. ? ?CT A/P w/contrast 08/2005: ?Impression:  ? 1. Unremarkable CT abdomen post cholecystectomy. Unremarkable CT pelvis ? ?ASSESSMENT AND PLAN: ? ?Alternating constipation and diarrhea ?Ab discomfort ?Colon cancer screening ?Hemorrhoids ?Patient  presents with alternating constipation and diarrhea that has been longstanding.  More recently, she feels like her bowel habits have been trending towards the diarrhea side of the spectrum.  We will have her attempt to follow a low FODMAP diet and start a daily fiber supplement.  We will give her some Bentyl to help with abdominal cramping as needed.  Patient is also due for colon cancer screening so we will go ahead and schedule her for colonoscopy.  Will examine her hemorrhoids at the time of her colonoscopy ?- Low FODMAP diet ?- Start fiber supplement daily ?- Start Bentyl PRN ?- Colonoscopy [facility] ? ?Christia Reading, MD ? ?

## 2021-05-26 ENCOUNTER — Encounter: Payer: Self-pay | Admitting: Internal Medicine

## 2021-06-03 ENCOUNTER — Encounter: Payer: Managed Care, Other (non HMO) | Admitting: Internal Medicine

## 2021-06-24 ENCOUNTER — Ambulatory Visit (AMBULATORY_SURGERY_CENTER): Payer: Commercial Managed Care - HMO | Admitting: Internal Medicine

## 2021-06-24 ENCOUNTER — Encounter: Payer: Self-pay | Admitting: Internal Medicine

## 2021-06-24 VITALS — BP 125/88 | HR 65 | Temp 99.5°F | Resp 10 | Ht 67.0 in | Wt 162.0 lb

## 2021-06-24 DIAGNOSIS — D123 Benign neoplasm of transverse colon: Secondary | ICD-10-CM | POA: Diagnosis not present

## 2021-06-24 DIAGNOSIS — R198 Other specified symptoms and signs involving the digestive system and abdomen: Secondary | ICD-10-CM

## 2021-06-24 DIAGNOSIS — Z1211 Encounter for screening for malignant neoplasm of colon: Secondary | ICD-10-CM | POA: Diagnosis present

## 2021-06-24 MED ORDER — SODIUM CHLORIDE 0.9 % IV SOLN: 500.0000 mL | Freq: Once | INTRAVENOUS | Status: DC

## 2021-06-24 MED ORDER — HYDROCORTISONE (PERIANAL) 2.5 % EX CREA: 1.0000 "application " | TOPICAL_CREAM | Freq: Two times a day (BID) | CUTANEOUS | 0 refills | Status: AC

## 2021-06-24 NOTE — Patient Instructions (Addendum)
Handouts provided on polyps, diverticulosis and hemorrhoids.  ? ?Use Anusol HC cream to rectum twice daily for 7 days.  ? ?YOU HAD AN ENDOSCOPIC PROCEDURE TODAY AT [facility]:   Refer to the procedure report that was given to you for any specific questions about what was found during the examination.  If the procedure report does not answer your questions, please call your gastroenterologist to clarify.  If you requested that your care partner not be given the details of your procedure findings, then the procedure report has been included in a sealed envelope for you to review at your convenience later. ? ?YOU SHOULD EXPECT: Some feelings of bloating in the abdomen. Passage of more gas than usual.  Walking can help get rid of the air that was put into your GI tract during the procedure and reduce the bloating. If you had a lower endoscopy (such as a colonoscopy or flexible sigmoidoscopy) you may notice spotting of blood in your stool or on the toilet paper. If you underwent a bowel prep for your procedure, you may not have a normal bowel movement for a few days. ? ?Please Note:  You might notice some irritation and congestion in your nose or some drainage.  This is from the oxygen used during your procedure.  There is no need for concern and it should clear up in a day or so. ? ?SYMPTOMS TO REPORT IMMEDIATELY: ? ?Following lower endoscopy (colonoscopy or flexible sigmoidoscopy): ? Excessive amounts of blood in the stool ? Significant tenderness or worsening of abdominal pains ? Swelling of the abdomen that is new, acute ? Fever of 100?F or higher ? ?For urgent or emergent issues, a gastroenterologist can be reached at any hour by calling [phone]. ?Do not use MyChart messaging for urgent concerns.  ? ? ?DIET:  We do recommend a small meal at first, but then you may proceed to your regular diet.  Drink plenty of fluids but you should avoid alcoholic beverages for 24 hours. ? ?ACTIVITY:  You  should plan to take it easy for the rest of today and you should NOT DRIVE or use heavy machinery until tomorrow (because of the sedation medicines used during the test).   ? ?FOLLOW UP: ?Our staff will call the number listed on your records 48-72 hours following your procedure to check on you and address any questions or concerns that you may have regarding the information given to you following your procedure. If we do not reach you, we will leave a message.  We will attempt to reach you two times.  During this call, we will ask if you have developed any symptoms of COVID 19. If you develop any symptoms (ie: fever, flu-like symptoms, shortness of breath, cough etc.) before then, please call [phone].  If you test positive for Covid 19 in the 2 weeks post procedure, please call and report this information to [place].   ? ?If any biopsies were taken you will be contacted by phone or by letter within the next 1-3 weeks.  Please call us at [phone] if you have not heard about the biopsies in 3 weeks.  ? ? ?SIGNATURES/CONFIDENTIALITY: ?You and/or your care partner have signed paperwork which will be entered into your electronic medical record.  These signatures attest to the fact that that the information above on your After Visit Summary has been reviewed and is understood.  Full responsibility of the confidentiality of this discharge information lies with you and/or  your care-partner. ? ?

## 2021-06-24 NOTE — Op Note (Signed)
[place] ?Patient Name: Carol Pope ?Procedure Date: 06/24/2021 1:34 PM ?MRN: [id number] ?Endoscopist: Sonny Masters "Christia Reading ,  ?Age: 46 ?Referring MD:  ?Date of Birth: 04-19-1975 ?Gender: Female ?Account #: [id number] ?Procedure:                Colonoscopy ?Indications:              Screening for colorectal malignant neoplasm ?Medicines:                Monitored Anesthesia Care ?Procedure:                Pre-Anesthesia Assessment: ?                          - Prior to the procedure, a History and Physical  ?                          was performed, and patient medications and  ?                          allergies were reviewed. The patient's tolerance of  ?                          previous anesthesia was also reviewed. The risks  ?                          and benefits of the procedure and the sedation  ?                          options and risks were discussed with the patient.  ?                          All questions were answered, and informed consent  ?                          was obtained. Prior Anticoagulants: The patient has  ?                          taken no previous anticoagulant or antiplatelet  ?                          agents. ASA Grade Assessment: II - A patient with  ?                          mild systemic disease. After reviewing the risks  ?                          and benefits, the patient was deemed in  ?                          satisfactory condition to undergo the procedure. ?                          After obtaining informed consent, the colonoscope  ?  was passed under direct vision. Throughout the  ?                          procedure, the patient's blood pressure, pulse, and  ?                          oxygen saturations were monitored continuously. The  ?                          Colonoscope was introduced through the anus and  ?                          advanced to the the terminal ileum. The colonoscopy  ?                          was performed  without difficulty. The patient  ?                          tolerated the procedure well. The quality of the  ?                          bowel preparation was good. The terminal ileum,  ?                          ileocecal valve, appendiceal orifice, and rectum  ?                          were photographed. ?Scope In: 1:44:33 PM ?Scope Out: 2:03:13 PM ?Scope Withdrawal Time: 0 hours 14 minutes 25 seconds  ?Total Procedure Duration: 0 hours 18 minutes 40 seconds  ?Findings:                 The terminal ileum appeared normal. ?                          A 2 mm polyp was found in the transverse colon. The  ?                          polyp was sessile. The polyp was removed with a  ?                          cold biopsy forceps. Resection and retrieval were  ?                          complete. ?                          Non-bleeding internal hemorrhoids were found during  ?                          retroflexion. ?Complications:            No immediate complications. ?Estimated Blood Loss:     Estimated blood loss was minimal. ?Impression:               - The examined portion of  the ileum was normal. ?                          - One 2 mm polyp in the transverse colon, removed  ?                          with a cold biopsy forceps. Resected and retrieved. ?                          - Non-bleeding internal hemorrhoids. ?Recommendation:           - Discharge patient to home (with escort). ?                          - Await pathology results. ?                          - Anusol HC BID for 7 days. ?                          - The findings and recommendations were discussed  ?                          with the patient. ?Georgian Co,  ?06/24/2021 2:23:15 PM ?

## 2021-06-24 NOTE — Progress Notes (Signed)
Report given to PACU, vss 

## 2021-06-24 NOTE — Progress Notes (Signed)
VS  DT ? ?Pt's states no medical or surgical changes since previsit or office visit. ? ?

## 2021-06-24 NOTE — Progress Notes (Signed)
Called to room to assist during endoscopic procedure.  Patient ID and intended procedure confirmed with present staff. Received instructions for my participation in the procedure from the performing physician.  

## 2021-06-24 NOTE — Progress Notes (Signed)
? ?GASTROENTEROLOGY PROCEDURE H&P NOTE  ? ?Primary Care Physician: ?Vania Rea, MD ? ? ? ?Reason for Procedure:   Colon cancer screening ? ?Plan:    Colonoscopy ? ?Patient is appropriate for endoscopic procedure(s) in the ambulatory ([facility]) setting. ? ?The nature of the procedure, as well as the risks, benefits, and alternatives were carefully and thoroughly reviewed with the patient. Ample time for discussion and questions allowed. The patient understood, was satisfied, and agreed to proceed.  ? ? ? ?HPI: ?Carol Pope is a 46 y.o. female who presents for colonoscopy for evaluation of colon cancer screening .  Patient was most recently seen in the Gastroenterology Clinic on 05/17/21.  No interval change in medical history since that appointment. Please refer to that note for full details regarding GI history and clinical presentation.  ? ?Past Medical History:  ?Diagnosis Date  ? Abnormal Pap smear   ? Anxiety   ? Discoid lupus   ? ? ?Past Surgical History:  ?Procedure Laterality Date  ? CHOLECYSTECTOMY    ? GYNECOLOGIC CRYOSURGERY    ? ? ?Prior to Admission medications   ?Medication Sig Start Date End Date Taking? Authorizing Provider  ?ALPRAZolam (XANAX) 0.5 MG tablet Take 1 tablet by mouth as needed. 05/05/21  Yes [provider]  ?escitalopram (LEXAPRO) 10 MG tablet Take 10 mg by mouth daily. 05/05/21  Yes [provider]  ?hydroxychloroquine (PLAQUENIL) 200 MG tablet Take 200 mg by mouth daily. 03/11/21  Yes [provider]  ?dicyclomine (BENTYL) 10 MG capsule Take 1 capsule (10 mg total) by mouth 3 (three) times daily as needed for spasms. ?Patient not taking: Reported on 06/24/2021 05/17/21   Sharyn Creamer, MD  ?Wheat Dextrin (BENEFIBER) POWD Take 1 Dose by mouth daily as needed. 05/17/21   Sharyn Creamer, MD  ? ? ?Current Outpatient Medications  ?Medication Sig Dispense Refill  ? ALPRAZolam (XANAX) 0.5 MG tablet Take 1 tablet by mouth as needed.    ? escitalopram (LEXAPRO) 10 MG  tablet Take 10 mg by mouth daily.    ? hydroxychloroquine (PLAQUENIL) 200 MG tablet Take 200 mg by mouth daily.    ? dicyclomine (BENTYL) 10 MG capsule Take 1 capsule (10 mg total) by mouth 3 (three) times daily as needed for spasms. (Patient not taking: Reported on 06/24/2021) 90 capsule 2  ? Wheat Dextrin (BENEFIBER) POWD Take 1 Dose by mouth daily as needed. 475 g 0  ? ?Current Facility-Administered Medications  ?Medication Dose Route Frequency Provider Last Rate Last Admin  ? 0.9 %  sodium chloride infusion  500 mL Intravenous Once Sharyn Creamer, MD      ? ? ?Allergies as of 06/24/2021 - Review Complete 06/24/2021  ?Allergen Reaction Noted  ? Codeine Hives 02/04/2011  ? ? ?Family History  ?Problem Relation Age of Onset  ? Birth defects Father   ?     hole in heart  ? Diabetes Father   ? Pernicious anemia Father   ? Drug abuse Father   ?     prescription pain killers  ? Birth defects Sister   ?     hole in heart  ? Other Sister   ?     auto immune disease  ? Anesthesia problems Neg Hx   ? Hypotension Neg Hx   ? Pseudochol deficiency Neg Hx   ? Malignant hyperthermia Neg Hx   ? Colon cancer Neg Hx   ? Esophageal cancer Neg Hx   ? Stomach  cancer Neg Hx   ? Colon polyps Neg Hx   ? ? ?Social History  ? ?Socioeconomic History  ? Marital status: Married  ?  Spouse name: Not on file  ? Number of children: 1  ? Years of education: Not on file  ? Highest education level: Not on file  ?Occupational History  ? Occupation: Armed forces operational officer  ?Tobacco Use  ? Smoking status: Former  ?  Packs/day: 1.00  ?  Years: 15.00  ?  Pack years: 15.00  ?  Types: Cigarettes  ?  Quit date: 07/01/2010  ?  Years since quitting: 10.9  ? Smokeless tobacco: Never  ?Vaping Use  ? Vaping Use: Former  ?Substance and Sexual Activity  ? Alcohol use: Yes  ?  Comment: occasionally  ? Drug use: No  ? Sexual activity: Yes  ?Other Topics Concern  ? Not on file  ?Social History Narrative  ? Not on file  ? ?Social Determinants of Health  ? ?Financial Resource  Strain: Not on file  ?Food Insecurity: Not on file  ?Transportation Needs: Not on file  ?Physical Activity: Not on file  ?Stress: Not on file  ?Social Connections: Not on file  ?Intimate Partner Violence: Not on file  ? ? ?Physical Exam: ?Vital signs in last 24 hours: ?BP 117/85   Pulse 85   Temp 99.5 ?F (37.5 ?C)   Ht '5\' 7"'$  (1.702 m)   Wt 162 lb (73.5 kg)   LMP 06/17/2021   SpO2 97%   BMI 25.37 kg/m?  ?GEN: NAD ?EYE: Sclerae anicteric ?ENT: MMM ?CV: Non-tachycardic ?Pulm: No increased WOB ?GI: Soft ?NEURO:  Alert & Oriented ? ? ?Christia Reading, MD ?[facility] ? ? ?06/24/2021 1:34 PM ? ?

## 2021-06-28 ENCOUNTER — Telehealth: Payer: Self-pay

## 2021-06-28 NOTE — Telephone Encounter (Signed)
?  Follow up Call- ? ? ?  06/24/2021  ?  1:03 PM  ?Call back number  ?Post procedure Call Back phone  # [phone]  ?Permission to leave phone message Yes  ?  ? ?Patient questions: ? ?Do you have a fever, pain , or abdominal swelling? No. ?Pain Score  0 * ? ?Have you tolerated food without any problems? Yes.   ? ?Have you been able to return to your normal activities? Yes.   ? ?Do you have any questions about your discharge instructions: ?Diet   No. ?Medications  No. ?Follow up visit  No. ? ?Do you have questions or concerns about your Care? No. ? ?Actions: ?* If pain score is 4 or above: ?No action needed, pain <4. ? ? ?

## 2021-06-29 ENCOUNTER — Encounter: Payer: Self-pay | Admitting: Internal Medicine

## 2021-07-02 ENCOUNTER — Encounter (INDEPENDENT_AMBULATORY_CARE_PROVIDER_SITE_OTHER): Admitting: Family Medicine

## 2021-11-05 ENCOUNTER — Encounter: Payer: Self-pay | Admitting: Internal Medicine

## 2021-12-08 ENCOUNTER — Ambulatory Visit: Payer: Commercial Managed Care - HMO | Admitting: Nurse Practitioner

## 2021-12-09 ENCOUNTER — Ambulatory Visit: Payer: Commercial Managed Care - HMO | Admitting: Physician Assistant

## 2022-01-22 ENCOUNTER — Inpatient Hospital Stay
Admit: 2022-01-22 | Disposition: A | Discharge status: Home / Self Care | Payer: BLUE CROSS/BLUE SHIELD | Attending: Physician Assistant | Primary: Family Medicine

## 2022-01-22 DIAGNOSIS — J329 Chronic sinusitis, unspecified: Principal | ICD-10-CM

## 2022-01-22 DIAGNOSIS — B9689 Other specified bacterial agents as the cause of diseases classified elsewhere: Secondary | ICD-10-CM

## 2022-01-22 LAB — POCT SARS-COV-2/FLU A&B
POCT Flu A: NOT DETECTED
POCT Flu B: NOT DETECTED
POCT SARS-CoV-2: NOT DETECTED

## 2022-01-22 MED ORDER — amoxicillin-pot clavulanate (Augmentin) 875-125 mg tablet
875-125 | ORAL_TABLET | Freq: Two times a day (BID) | ORAL | 0 refills | 7.00000 days | Status: AC
Start: 2022-01-22 — End: 2022-01-29

## 2022-01-22 NOTE — ED Provider Notes (Signed)
History  Chief Complaint   Patient presents with   . Nasal Congestion   . Cough   . Earache     46 year old female patient presents complaining of cough congestion left ear decreased hearing.  The patient tells me that starting about 2 to 3 weeks ago now she began develop some nasal congestion.  Along with this she has had green nasal discharge with sinus pressure and left ear decreased hearing.  She has had a cough.  No fevers.  No shortness of breath or wheezing.  No vomiting or diarrhea.      History provided by:  Patient      Past Medical History:   Diagnosis Date   . BMI 40.0-44.9, adult (CMS/HCC)    . Cholecystitis    . Lipoma    . Refraction error    . S/P gastrectomy        Past Surgical History:   Procedure Laterality Date   . CHOLECYSTECTOMY     . GASTRECTOMY         Family History   Problem Relation Name Age of Onset   . Hypertension Mother     . Brain Aneurysm Father         Social History     Tobacco Use   . Smoking status: Never   . Smokeless tobacco: Never   Substance Use Topics   . Alcohol use: Not on file   . Drug use: Not on file       Review of Systems   Constitutional: Positive for fatigue. Negative for chills, diaphoresis and fever.   HENT: Positive for congestion, hearing loss, postnasal drip, rhinorrhea and sinus pressure. Negative for drooling, ear discharge, ear pain, sinus pain, sore throat, trouble swallowing and voice change.    Respiratory: Positive for cough. Negative for chest tightness, shortness of breath and wheezing.    Gastrointestinal: Negative for diarrhea, nausea and vomiting.   Musculoskeletal: Negative for arthralgias, back pain, gait problem, joint swelling, myalgias, neck pain and neck stiffness.       Physical Exam  Vitals:    01/22/22 1134   BP: 118/80   BP Location: Right arm   Pulse: 84   Resp: 16   Temp: 37.2 C (98.9 F)   TempSrc: Oral   SpO2: 98%   Weight: 108.9 kg   Height: 1.651 m       Physical Exam  Vitals and nursing note reviewed.   Constitutional:        General: She is not in acute distress.     Appearance: Normal appearance. She is obese. She is not ill-appearing, toxic-appearing or diaphoretic.   HENT:      Head: Normocephalic and atraumatic.      Right Ear: Tympanic membrane, ear canal and external ear normal.      Left Ear: Ear canal and external ear normal.      Ears:      Comments: Erythema to the left tympanic membrane     Nose: Congestion present.      Mouth/Throat:      Mouth: Mucous membranes are moist.      Pharynx: Oropharynx is clear. No oropharyngeal exudate or posterior oropharyngeal erythema.   Eyes:      General:         Right eye: No discharge.         Left eye: No discharge.      Extraocular Movements: Extraocular movements intact.      Conjunctiva/sclera:  Conjunctivae normal.   Cardiovascular:      Rate and Rhythm: Normal rate and regular rhythm.      Heart sounds: No murmur heard.     No gallop.   Pulmonary:      Effort: Pulmonary effort is normal. No respiratory distress.      Breath sounds: Normal breath sounds. No wheezing or rales.   Abdominal:      General: Abdomen is flat. There is no distension.   Musculoskeletal:         General: No swelling or deformity. Normal range of motion.      Cervical back: Normal range of motion and neck supple. No rigidity or tenderness.   Lymphadenopathy:      Cervical: No cervical adenopathy.   Skin:     General: Skin is warm.      Coloration: Skin is not jaundiced.      Findings: No bruising or lesion.   Neurological:      General: No focal deficit present.      Mental Status: She is alert and oriented to person, place, and time. Mental status is at baseline.      Cranial Nerves: No cranial nerve deficit.      Motor: No weakness.   Psychiatric:         Mood and Affect: Mood normal.         Behavior: Behavior normal.              No orders to display     Labs Reviewed   POCT SARS-COV-2/FLU A&B - Normal       Result Value    POCT SARS-CoV-2 Not Detected      POCT Flu A Not Detected      POCT Flu B Not  Detected      Narrative:     This test was performed with the [facility] SARS-COV-2 & Influenza A/B RNA by RT PCR molecular test. Not detected results do not rule out infection with COVID-19, Influenza A and/or Influenza B. This test is offered under Emergency use Authorization by the FDA.       Procedures  Procedures    UC Course  Diagnoses as of 01/22/22 1158   Bacterial sinusitis   Acute suppurative otitis media of left ear without spontaneous rupture of tympanic membrane, recurrence not specified       Medical Decision Making  46 year old female patient presents complaining of cough congestion sinus pressure left ear decreased hearing.  Vital signs unremarkable.  Physical exam reveals nasal congestion, erythema to the left tympanic membrane.  COVID-19 influenza testing was performed which is negative.  Given length of symptoms as well as findings we will treat for otitis media/bacterial sinusitis, Augmentin sent to the pharmacy.  Recommend over-the-counter products for her symptoms. The patient will follow up with the primary care doctor as soon as possible.  Educated on red flags when to return or go to the emergency department for any worsening symptoms.  The patient feels comfortable with this plan.      Acute suppurative otitis media of left ear without spontaneous rupture of tympanic membrane, recurrence not specified: acute illness or injury  Bacterial sinusitis: acute illness or injury  Amount and/or Complexity of Data Reviewed  Labs: ordered.      Risk  Prescription drug management.          Discharge Meds  ED Prescriptions     Medication Sig Dispense Start Date End Date Auth. Provider  amoxicillin-pot clavulanate (Augmentin) 875-125 mg tablet Take 1 tablet by mouth every 12 (twelve) hours for 7 days. 14 tablet 01/22/2022 01/29/2022 Aimi Essner Renae Fickle Fransisco Messmer, PA          Home Meds  Prior to Admission medications    Medication Sig Start Date End Date Taking? Authorizing Provider   levonorgestrel (Mirena) 20  mcg/24 hours (7 yrs) 52 mg IUD 52 mg by intrauterine route. 07/26/17   Historical Provider, MD         Total amount of time spent on day of service doing chart review, history and physical exam, order/ review results of testing ordered (if any), patient counseling, documentation: 30 minutes.      Patient encounter note may have been created using voice recognition software and in real time during the office visit. Please excuse any typographical errors that may not have been edited out.            Ledell Noss Noelia Lenart, [place]  01/22/22 1158

## 2022-01-30 ENCOUNTER — Inpatient Hospital Stay
Admit: 2022-01-30 | Disposition: A | Discharge status: Home / Self Care | Payer: BLUE CROSS/BLUE SHIELD | Attending: Family | Primary: Family Medicine

## 2022-01-30 DIAGNOSIS — J019 Acute sinusitis, unspecified: Secondary | ICD-10-CM

## 2022-01-30 MED ORDER — predniSONE (Deltasone) 50 mg tablet
50 | ORAL_TABLET | Freq: Every day | ORAL | 0 refills | Status: AC
Start: 2022-01-30 — End: 2022-02-04

## 2022-01-30 NOTE — ED Provider Notes (Signed)
History  Chief Complaint   Patient presents with   . Cough   . Nasal Congestion     Patient states she was seen here last Sat for a sinus infection and treated with antibiotics-completed them yesterday and still not feeling any better- states she was also tested for everything and all Neg     46 year old female, presents to urgent care for persisting nasal congestion, sinus symptoms postnasal drip x 5 weeks.  Patient seen on 12/2 here in [facility], with negative COVID, flu, treated for acute bacterial sinusitis/otitis media with Augmentin,  completed yesterday  Reports resolved ear pain, but persisting sinus congestion, and his pressure and postnasal drip.    She denies any sore throat, headache, neck pain, facial pain, sinus pain, facial swelling, fever, chills, nausea, vomiting, cough.  She has not taken or tried anything else for her symptoms.  No other associated symptoms or concerns.  Otherwise feeling well.              Past Medical History:   Diagnosis Date   . BMI 40.0-44.9, adult (CMS/HCC)    . Cholecystitis    . Lipoma    . Refraction error    . S/P gastrectomy        Past Surgical History:   Procedure Laterality Date   . CHOLECYSTECTOMY     . GASTRECTOMY         Family History   Problem Relation Name Age of Onset   . Hypertension Mother     . Brain Aneurysm Father         Social History     Tobacco Use   . Smoking status: Never   . Smokeless tobacco: Never   Vaping Use   . Vaping Use: Never used   Substance Use Topics   . Alcohol use: Not on file   . Drug use: Not on file       Review of Systems   Constitutional: Negative for chills, diaphoresis, fatigue and fever.   HENT: Positive for congestion, postnasal drip, rhinorrhea and sinus pressure. Negative for sinus pain and sore throat.    Eyes: Negative.    Respiratory: Negative.    Cardiovascular: Negative.    Gastrointestinal: Negative.    Neurological: Negative.    All other systems reviewed and are negative.      Physical Exam  Vitals:    01/30/22 1411   BP:  118/72   BP Location: Right arm   Patient Position: Sitting   Pulse: 76   Resp: 18   Temp: 36.7 C (98.1 F)   TempSrc: Oral   SpO2: 96%   Weight: 108.9 kg   Height: 1.651 m       Physical Exam  Vitals and nursing note reviewed.   Constitutional:       General: She is not in acute distress.     Appearance: Normal appearance. She is not ill-appearing.   HENT:      Right Ear: Tympanic membrane, ear canal and external ear normal.      Left Ear: Tympanic membrane, ear canal and external ear normal.      Nose: Congestion present. No rhinorrhea.      Comments: Moderate sinus congestion, no active rhinorrhea, no sinus tenderness to percussion, no overlying skin color change redness swelling or warmth.  There is nasal polyp of the right nare, small, nonobstructing,     Mouth/Throat:      Mouth: Mucous membranes are moist.  Pharynx: Oropharynx is clear. No oropharyngeal exudate or posterior oropharyngeal erythema.   Eyes:      General:         Right eye: No discharge.         Left eye: No discharge.      Extraocular Movements: Extraocular movements intact.      Conjunctiva/sclera: Conjunctivae normal.      Pupils: Pupils are equal, round, and reactive to light.   Cardiovascular:      Rate and Rhythm: Normal rate and regular rhythm.      Pulses: Normal pulses.      Heart sounds: Normal heart sounds.   Pulmonary:      Effort: Pulmonary effort is normal. No respiratory distress.      Breath sounds: Normal breath sounds. No stridor. No wheezing, rhonchi or rales.   Chest:      Chest wall: No tenderness.   Musculoskeletal:      Cervical back: Normal range of motion. No rigidity or tenderness.   Lymphadenopathy:      Cervical: No cervical adenopathy.   Skin:     General: Skin is warm and dry.      Findings: No erythema or rash.   Neurological:      General: No focal deficit present.      Mental Status: She is alert and oriented to person, place, and time.   Psychiatric:         Mood and Affect: Mood normal.              No  orders to display     Labs Reviewed - No data to display    Procedures  Procedures    UC Course  Diagnoses as of 01/30/22 1433   Acute sinusitis, recurrence not specified, unspecified location       Medical Decision Making  This is a [age] year old female, no underlying medical history, well-appearing, no acute distress and hemodynamically normal.  She presents with a 5-week history of sinus symptoms, seen in the urgent care last week on 12/2 and treated for acute bacterial sinusitis and otitis media with Augmentin which she completed yesterday with improvement of ear pain but no improvement of sinus symptoms.  Physical exam reveals mild congestion, no active rhinorrhea and no distinctive sinus tenderness to cavities.  The nares are patent there is a small nasal polyp of the left nare.  This is nonobstructing.  There is no facial swelling  No cough, clear lung sounds throughout    Given patient is well-appearing, hemodynamically normal, without sinus tenderness, nasal obstruction, facial swelling, to suggest underlying abscess, cellulitis, the duration of her symptoms, and no improvement with Augmentin make bacterial sinusitis less likely although it is possible to have a resistant strain, but given she is not worse, and continues without fever, will trial on prednisone first, she will follow very closely with her doctor, as ENT referral may be recommended for persisting symptoms, we also discussed possible need to trial a different antibiotic, patient is amenable to treatment plan return for any new or acutely worsening of symptoms    Acute sinusitis, recurrence not specified, unspecified location: complicated acute illness or injury  Risk  Prescription drug management.          Discharge Meds  ED Prescriptions     Medication Sig Dispense Start Date End Date Auth. Provider    predniSONE (Deltasone) 50 mg tablet Take 1 tablet (50 mg) by mouth once daily for 5 days. 5 tablet  01/30/2022 02/04/2022 Mickey [place], NP           Home Meds  Prior to Admission medications    Medication Sig Start Date End Date Taking? Authorizing Provider   amoxicillin-pot clavulanate (Augmentin) 875-125 mg tablet Take 1 tablet by mouth every 12 (twelve) hours for 7 days. 01/22/22 01/29/22  Ledell Noss [place], PA   levonorgestrel (Mirena) 20 mcg/24 hours (7 yrs) 52 mg IUD 52 mg by intrauterine route. 07/26/17   Historical Provider, MD         Total amount of time spent on day of service doing chart review, history and physical exam, order/ review results of testing ordered (if any), patient counseling, documentation: 35 minutes.      Patient encounter note may have been created using voice recognition software and in real time during the office visit. Please excuse any typographical errors that may not have been edited out.            Mickey Bryant, NP  01/30/22 1433

## 2022-01-30 NOTE — Discharge Instructions (Addendum)
You have been seen in the urgent care center today.  We are treating you for sinus inflammation, with a trial of prednisone.  Start this medication and take it as prescribed.  Patient to follow-up with your doctor by Tuesday for recheck to ensure sinus of Dems are improving, failure to have improvement would require reevaluation and possible additional management.  For any new change or worsening of symptoms please return to the urgent care or emergency room follow-up any questions or concerns

## 2022-04-08 ENCOUNTER — Encounter (HOSPITAL_BASED_OUTPATIENT_CLINIC_OR_DEPARTMENT_OTHER): Payer: Self-pay

## 2022-04-08 ENCOUNTER — Other Ambulatory Visit (HOSPITAL_BASED_OUTPATIENT_CLINIC_OR_DEPARTMENT_OTHER): Payer: Self-pay

## 2022-04-08 ENCOUNTER — Emergency Department (HOSPITAL_BASED_OUTPATIENT_CLINIC_OR_DEPARTMENT_OTHER)
Admission: EM | Admit: 2022-04-08 | Discharge: 2022-04-08 | Disposition: A | Discharge status: Home / Self Care | Payer: 59 | Attending: Emergency Medicine | Admitting: Emergency Medicine

## 2022-04-08 ENCOUNTER — Other Ambulatory Visit: Payer: Self-pay

## 2022-04-08 ENCOUNTER — Emergency Department (HOSPITAL_BASED_OUTPATIENT_CLINIC_OR_DEPARTMENT_OTHER): Payer: 59

## 2022-04-08 DIAGNOSIS — J40 Bronchitis, not specified as acute or chronic: Secondary | ICD-10-CM | POA: Insufficient documentation

## 2022-04-08 DIAGNOSIS — J101 Influenza due to other identified influenza virus with other respiratory manifestations: Secondary | ICD-10-CM | POA: Diagnosis not present

## 2022-04-08 DIAGNOSIS — Z87891 Personal history of nicotine dependence: Secondary | ICD-10-CM | POA: Insufficient documentation

## 2022-04-08 DIAGNOSIS — U071 COVID-19: Secondary | ICD-10-CM | POA: Diagnosis not present

## 2022-04-08 DIAGNOSIS — R059 Cough, unspecified: Secondary | ICD-10-CM | POA: Diagnosis not present

## 2022-04-08 DIAGNOSIS — R0602 Shortness of breath: Secondary | ICD-10-CM | POA: Diagnosis not present

## 2022-04-08 LAB — RESP PANEL BY RT-PCR (RSV, FLU A&B, COVID)  RVPGX2
Influenza A by PCR: NEGATIVE
Influenza B by PCR: POSITIVE — AB
Resp Syncytial Virus by PCR: NEGATIVE
SARS Coronavirus 2 by RT PCR: POSITIVE — AB

## 2022-04-08 MED ORDER — AZITHROMYCIN 250 MG PO TABS
250.0000 mg | ORAL_TABLET | Freq: Every day | ORAL | 0 refills | Status: AC
  Filled 2022-04-08: qty 6, 5d supply, fill #0

## 2022-04-08 MED ORDER — ALBUTEROL SULFATE HFA 108 (90 BASE) MCG/ACT IN AERS
2.0000 | INHALATION_SPRAY | Freq: Once | RESPIRATORY_TRACT | Status: AC
  Administered 2022-04-08: 2 via RESPIRATORY_TRACT
  Filled 2022-04-08: qty 6.7

## 2022-04-08 MED ORDER — PREDNISONE 20 MG PO TABS
40.0000 mg | ORAL_TABLET | Freq: Every day | ORAL | 0 refills | Status: AC
  Filled 2022-04-08: qty 10, 5d supply, fill #0

## 2022-04-08 MED ORDER — PREDNISONE 50 MG PO TABS
60.0000 mg | ORAL_TABLET | Freq: Once | ORAL | Status: AC
  Administered 2022-04-08: 60 mg via ORAL
  Filled 2022-04-08: qty 1

## 2022-04-08 MED ORDER — BENZONATATE 100 MG PO CAPS
100.0000 mg | ORAL_CAPSULE | Freq: Three times a day (TID) | ORAL | 0 refills | Status: AC
  Filled 2022-04-08: qty 21, 7d supply, fill #0

## 2022-04-08 NOTE — ED Triage Notes (Signed)
Pt to er, pt states that her husband and daughter had covid about two weeks ago, states that she felt that she got it as well, but didn't get tested, states that she is here for a cough and sob, states that her back and chest hurts, and the pain is worse when she coughs.  Pt states that generally she feels poorly

## 2022-04-08 NOTE — ED Provider Notes (Signed)
[facility] [facility] Provider Note   CSN: [id number] Arrival date & time: 04/08/22  1446     History  Chief Complaint  Patient presents with   Shortness of Breath    Carol Pope is a 47 y.o. female.  Patient here with cough and viral symptoms for the last week or so.  Exposure to COVID.  She has a dry cough.  History of lupus on Plaquenil.  Former smoker.  Has had bronchitis in the past use inhalers in the past.  She has pain when she coughs but otherwise denies any major shortness of breath or chest pain or abdominal pain or nausea vomiting diarrhea.  Nothing makes it worse or better.  The history is provided by the patient.       Home Medications Prior to Admission medications   Medication Sig Start Date End Date Taking? Authorizing Provider  azithromycin (ZITHROMAX) 250 MG tablet Take 1 tablet (250 mg total) by mouth daily. Take first 2 tablets together, then 1 every day until finished. 04/08/22  Yes Zaria Taha, DO  benzonatate (TESSALON) 100 MG capsule Take 1 capsule (100 mg total) by mouth every 8 (eight) hours. 04/08/22  Yes Margerie Fraiser, DO  predniSONE (DELTASONE) 20 MG tablet Take 2 tablets (40 mg total) by mouth daily for 5 days. 04/08/22 04/13/22 Yes Lalonnie Shaffer, DO  ALPRAZolam Duanne Moron) 0.5 MG tablet Take 1 tablet by mouth as needed. 05/05/21   [provider]  dicyclomine (BENTYL) 10 MG capsule Take 1 capsule (10 mg total) by mouth 3 (three) times daily as needed for spasms. Patient not taking: Reported on 06/24/2021 05/17/21   Sharyn Creamer, MD  escitalopram (LEXAPRO) 10 MG tablet Take 10 mg by mouth daily. 05/05/21   [provider]  hydroxychloroquine (PLAQUENIL) 200 MG tablet Take 200 mg by mouth daily. 03/11/21   [provider]  Wheat Dextrin (BENEFIBER) POWD Take 1 Dose by mouth daily as needed. 05/17/21   Sharyn Creamer, MD      Allergies    Codeine    Review of Systems   Review of  Systems  Physical Exam Updated Vital Signs BP (!) 180/107 (BP Location: Left Arm)   Pulse 93   Temp 99.6 F (37.6 C) (Oral)   Resp 16   Ht 5' 7"$  (1.702 m)   Wt 72.6 kg   SpO2 100%   BMI 25.06 kg/m  Physical Exam Vitals and nursing note reviewed.  Constitutional:      General: She is not in acute distress.    Appearance: She is well-developed. She is not ill-appearing.  HENT:     Head: Normocephalic and atraumatic.     Mouth/Throat:     Mouth: Mucous membranes are moist.  Eyes:     Extraocular Movements: Extraocular movements intact.     Conjunctiva/sclera: Conjunctivae normal.     Pupils: Pupils are equal, round, and reactive to light.  Cardiovascular:     Rate and Rhythm: Normal rate and regular rhythm.     Pulses: Normal pulses.     Heart sounds: Normal heart sounds. No murmur heard. Pulmonary:     Effort: Pulmonary effort is normal. No respiratory distress.     Breath sounds: Normal breath sounds. No decreased breath sounds, wheezing, rhonchi or rales.  Abdominal:     Palpations: Abdomen is soft.     Tenderness: There is no abdominal tenderness.  Musculoskeletal:        General:  No swelling.     Cervical back: Normal range of motion and neck supple.  Skin:    General: Skin is warm and dry.     Capillary Refill: Capillary refill takes less than 2 seconds.  Neurological:     Mental Status: She is alert.  Psychiatric:        Mood and Affect: Mood normal.     ED Results / Procedures / Treatments   Labs (all labs ordered are listed, but only abnormal results are displayed) Labs Reviewed  RESP PANEL BY RT-PCR (RSV, FLU A&B, COVID)  [id number]    EKG None  Radiology DG Chest Portable 1 View  Result Date: 04/08/2022 CLINICAL DATA:  Cough. EXAM: PORTABLE CHEST 1 VIEW COMPARISON:  12/17/2008. FINDINGS: Clear lungs. Normal heart size and mediastinal contours. No pleural effusion or pneumothorax. Visualized bones and upper abdomen are unremarkable. IMPRESSION: No  evidence of acute cardiopulmonary disease. Electronically Signed   By: Emmit Alexanders M.D.   On: 04/08/2022 15:20    Procedures Procedures    Medications Ordered in ED Medications  predniSONE (DELTASONE) tablet 60 mg (has no administration in time range)  albuterol (VENTOLIN HFA) 108 (90 Base) MCG/ACT inhaler 2 puff (has no administration in time range)    ED Course/ Medical Decision Making/ A&P                             Medical Decision Making Amount and/or Complexity of Data Reviewed Radiology: ordered.   Judithann Sauger is here with cough.  Normal vitals except for mild high blood pressure.  Fairly clear breath sounds.  She has a dry hacking cough on exam.  Exposure to [place] recently.  Symptoms for the last week or so.  Chest x-ray per my review and interpretation shows no evidence of pneumonia or pneumothorax.  EKG shows sinus rhythm.  No ischemic changes.  I have no concern for ACS or PE or sepsis at this time.  I suspect that she has a postviral bronchitis.  Will put her on prednisone, albuterol, Tessalon Perles and Z-Pak.  She is well-appearing.  Normal oxygen.  Normal work of breathing.  She understands return precautions.  Discharged in good condition.  This chart was dictated using voice recognition software.  Despite best efforts to proofread,  errors can occur which can change the documentation meaning.         Final Clinical Impression(s) / ED Diagnoses Final diagnoses:  Bronchitis    Rx / DC Orders ED Discharge Orders          Ordered    predniSONE (DELTASONE) 20 MG tablet  Daily        04/08/22 1545    azithromycin (ZITHROMAX) 250 MG tablet  Daily        04/08/22 1545    benzonatate (TESSALON) 100 MG capsule  Every 8 hours        04/08/22 [place], Hudson, DO 04/08/22 1547

## 2022-04-08 NOTE — ED Notes (Signed)
Pt stable at time of discharge. RR even and unlabored. No acute distress noted. Pt verbalized understanding of discharge instructions as discussed.

## 2022-10-18 ENCOUNTER — Inpatient Hospital Stay
Admit: 2022-10-18 | Disposition: A | Discharge status: Home / Self Care | Payer: BLUE CROSS/BLUE SHIELD | Attending: Adult Health | Primary: Family Medicine

## 2022-10-18 DIAGNOSIS — L309 Dermatitis, unspecified: Principal | ICD-10-CM

## 2022-10-18 MED ORDER — methylPREDNISolone (Medrol Dospak) 4 mg tablets
4 | ORAL_TABLET | ORAL | 0 refills | Status: AC
Start: 2022-10-18 — End: 2022-10-25

## 2022-10-18 MED ORDER — dexAMETHasone (Decadron) 10 mg/mL injection  - Omnicell Override Pull
10 | INTRAMUSCULAR | Status: AC
Start: 2022-10-18 — End: ?

## 2022-10-18 MED ORDER — dexAMETHasone (Decadron) injection 10 mg
10 | Freq: Once | INTRAMUSCULAR | Status: AC
Start: 2022-10-18 — End: 2022-10-18
  Administered 2022-10-18: 22:00:00 10 mg via INTRAMUSCULAR

## 2022-10-18 MED FILL — DEXAMETHASONE SODIUM PHOSPHATE 10 MG/ML INJECTION SOLUTION: 10 10 mg/mL | INTRAMUSCULAR | Qty: 1

## 2022-10-18 NOTE — Discharge Instructions (Addendum)
Medications as prescribed-proper administration and side effect profile discussed with patient  Over-the-counter as needed for symptom relief  Return to clinic as needed or follow-up with your PCP  ED if symptoms worsen/become severe  Allergy testing-please call for appt

## 2022-10-18 NOTE — ED Provider Notes (Signed)
History  Chief Complaint   Patient presents with    hives/itchiness all over body and swollen neck     Last night broke out in a rash on neck. This morning woke up with face/neck swelling feels hot/itchy.  Rash all over body/headache and slight difficulty to swallow started  about 2hrs ago. Took benadryl at 1:30pm today with no relief.      47 year old female presents urgent care for concern of allergic reaction.  No new soaps lotions detergents, no change in meds.  No new foods or recent travel.  Patient ports yesterday with rash to neck chest and arms "very itchy" she reports today at approximately 130 took Benadryl as she noted rash to her forearms, no hives but concerned that she felt "difficult to swallow" she is able to swallow her own secretions she has no drooling, no wheezing, no chest pain or shortness of breath.  She speaking in full sentences.  No history of allergies to foods medicines or environment.       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)    S/P gastrectomy       Past Medical History:   Diagnosis Date    BMI 40.0-44.9, adult (Multi-HCC)     Cholecystitis     Lipoma     Refraction error     S/P gastrectomy        Past Surgical History:   Procedure Laterality Date    CHOLECYSTECTOMY      GASTRECTOMY         Family History   Problem Relation Name Age of Onset    Hypertension Mother      Brain Aneurysm Father         Social History     Tobacco Use    Smoking status: Never    Smokeless tobacco: Never   Vaping Use    Vaping Use: Never used   Substance Use Topics    Alcohol use: Not on file    Drug use: Not on file        Review of Systems   All other systems reviewed and are negative.      Physical Exam  Vitals:    10/18/22 1742 10/18/22 1803   BP: (!) 149/105 (!) 144/83   BP Location: Left arm Right arm   Patient Position: Sitting Sitting   Pulse: 66    Resp: 18    Temp: 37.1 C (98.7 F)    TempSrc: Oral    SpO2: 97%    Weight: 111.1 kg    Height: 1.651 m        Physical Exam  Vitals and nursing note reviewed.   Constitutional:       Appearance: Normal appearance.   HENT:      Head: Normocephalic and atraumatic.      Right Ear: Tympanic membrane normal.      Left Ear: Tympanic membrane normal.      Nose: Nose normal.      Mouth/Throat:  Mouth: Mucous membranes are moist.   Eyes:      Conjunctiva/sclera: Conjunctivae normal.   Cardiovascular:      Rate and Rhythm: Normal rate and regular rhythm.      Pulses: Normal pulses.      Heart sounds: Normal heart sounds.   Pulmonary:      Effort: Pulmonary effort is normal.      Breath sounds: Normal breath sounds.   Abdominal:      General: Bowel sounds are normal.   Musculoskeletal:         General: Normal range of motion.      Cervical back: Normal range of motion and neck supple.   Lymphadenopathy:      Cervical: No cervical adenopathy.   Skin:     General: Skin is warm and dry.      Capillary Refill: Capillary refill takes less than 2 seconds.      Comments: Scattered areas of MAC/Pap dermatitis to bilateral forearms, anterior aspect.  No sign of infection.  Sparing face.  No angioedema   Neurological:      Mental Status: She is alert and oriented to person, place, and time.              No orders to display     Labs Reviewed - No data to display    Procedures  Procedures    UC Course  Diagnoses as of 10/18/22 1951   Dermatitis   Elevated blood pressure reading without diagnosis of hypertension       Medical Decision Making  47 year old female in no acute distress, speaking full sentences, no wheezing.  Patient reports yesterday body rash was worse slight improvement today  but notes swelling to neck, I do not appreciate any severe swelling, no angioedema on exam.  Given Decadron as patient feeling of difficulty swallowing, Medrol Dosepak as rash to bilateral arms noted, she does report urticarial rash yesterday  Symptom diary and follow up with allergist discussed.  ED if symptoms worsen/become severe    Risk  OTC drugs.  Prescription drug management.        Discharge Meds  ED Prescriptions       Medication Sig Dispense Start Date End Date Auth. Provider    methylPREDNISolone (Medrol Dospak) 4 mg tablets Follow schedule on package instructions 21 tablet 10/18/2022 10/25/2022 Lloyd Huger, NP            Home Meds  Prior to Admission medications    Medication Sig Start Date End Date Taking? Authorizing Provider   levonorgestrel (Mirena) 20 mcg/24 hours (7 yrs) 52 mg IUD 52 mg by intrauterine route. 07/26/17   Historical Provider, MD         Total amount of time spent on day of service doing chart review, history and physical exam,  order/ review results of testing ordered (if any), patient counseling, documentation: 30 minutes.      Patient encounter note may have been created using voice recognition software and in real time during the office visit. Please excuse any typographical errors that may not have been edited out.            Lloyd Huger, NP  10/18/22 [phone]

## 2022-11-30 DIAGNOSIS — N951 Menopausal and female climacteric states: Secondary | ICD-10-CM | POA: Diagnosis not present

## 2022-11-30 DIAGNOSIS — Z008 Encounter for other general examination: Secondary | ICD-10-CM | POA: Diagnosis not present

## 2022-11-30 DIAGNOSIS — Z01419 Encounter for gynecological examination (general) (routine) without abnormal findings: Secondary | ICD-10-CM | POA: Diagnosis not present

## 2022-11-30 DIAGNOSIS — Z1231 Encounter for screening mammogram for malignant neoplasm of breast: Secondary | ICD-10-CM | POA: Diagnosis not present

## 2023-03-08 ENCOUNTER — Ambulatory Visit
Admission: EM | Admit: 2023-03-08 | Discharge: 2023-03-08 | Disposition: A | Discharge status: Home / Self Care | Payer: No Typology Code available for payment source | Attending: Family Medicine | Admitting: Family Medicine

## 2023-03-08 DIAGNOSIS — J101 Influenza due to other identified influenza virus with other respiratory manifestations: Secondary | ICD-10-CM | POA: Diagnosis not present

## 2023-03-08 DIAGNOSIS — R051 Acute cough: Secondary | ICD-10-CM

## 2023-03-08 LAB — POCT INFLUENZA A/B
Influenza A, POC: POSITIVE — AB
Influenza B, POC: NEGATIVE

## 2023-03-08 MED ORDER — ACETAMINOPHEN 325 MG PO TABS
650.0000 mg | ORAL_TABLET | Freq: Once | ORAL | Status: AC
  Administered 2023-03-08: 650 mg via ORAL

## 2023-03-08 MED ORDER — OSELTAMIVIR PHOSPHATE 75 MG PO CAPS: 75.0000 mg | ORAL_CAPSULE | Freq: Two times a day (BID) | ORAL | 0 refills | Status: AC

## 2023-03-08 MED ORDER — PROMETHAZINE-DM 6.25-15 MG/5ML PO SYRP: 5.0000 mL | ORAL_SOLUTION | Freq: Four times a day (QID) | ORAL | 0 refills | Status: AC | PRN

## 2023-03-08 NOTE — ED Provider Notes (Signed)
 [facility]    CSN: [id number] Arrival date & time: 03/08/23  0801      History   Chief Complaint Chief Complaint  Patient presents with   Cough   Nasal Congestion   Generalized Body Aches    HPI Carol Pope is a 48 y.o. female  presents for evaluation of URI symptoms for 2 days. Patient reports associated symptoms of cough, congestion, body aches, runny nose, fevers, posttussive vomiting, ear pain. Denies diarrhea, sore throat, shortness of breath. Patient does not have a hx of asthma. Patient is a poor smoker.  Reports daughter has similar symptoms and has been exposed to flu.  Pt has taken cough medicine/Tylenol  OTC for symptoms. Pt has no other concerns at this time.    Cough Associated symptoms: ear pain, fever and myalgias     Past Medical History:  Diagnosis Date   Abnormal Pap smear    Anxiety    Discoid lupus     There are no active problems to display for this patient.   Past Surgical History:  Procedure Laterality Date   CHOLECYSTECTOMY     GYNECOLOGIC CRYOSURGERY      OB History     Gravida  2   Para  1   Term  1   Preterm  0   AB  1   Living  1      SAB  1   IAB  0   Ectopic  0   Multiple  0   Live Births  1            Home Medications    Prior to Admission medications   Medication Sig Start Date End Date Taking? Authorizing Provider  oseltamivir  (TAMIFLU ) 75 MG capsule Take 1 capsule (75 mg total) by mouth every 12 (twelve) hours. 03/08/23  Yes Kale Rondeau, Jodi R, NP  promethazine -dextromethorphan (PROMETHAZINE -DM) 6.25-15 MG/5ML syrup Take 5 mLs by mouth 4 (four) times daily as needed for cough. 03/08/23  Yes Hyun Marsalis, Jodi R, NP  ALPRAZolam (XANAX) 0.5 MG tablet Take 1 tablet by mouth as needed. 05/05/21   [provider]  azithromycin  (ZITHROMAX ) 250 MG tablet Take 1 tablet (250 mg total) by mouth daily. Take first 2 tablets together, then 1 every day until finished. 04/08/22   Curatolo, Adam, DO  benzonatate   (TESSALON ) 100 MG capsule Take 1 capsule (100 mg total) by mouth every 8 (eight) hours. 04/08/22   Curatolo, Adam, DO  dicyclomine  (BENTYL ) 10 MG capsule Take 1 capsule (10 mg total) by mouth 3 (three) times daily as needed for spasms. Patient not taking: Reported on 06/24/2021 05/17/21   Dorsey, Ying C, MD  escitalopram (LEXAPRO) 10 MG tablet Take 10 mg by mouth daily. 05/05/21   [provider]  hydroxychloroquine (PLAQUENIL) 200 MG tablet Take 200 mg by mouth daily. 03/11/21   [provider]  Wheat Dextrin (BENEFIBER) POWD Take 1 Dose by mouth daily as needed. 05/17/21   Daina Drum, MD    Family History Family History  Problem Relation Age of Onset   Birth defects Father        hole in heart   Diabetes Father    Pernicious anemia Father    Drug abuse Father        prescription pain killers   Birth defects Sister        hole in heart   Other Sister        auto immune disease   Anesthesia  problems Neg Hx    Hypotension Neg Hx    Pseudochol deficiency Neg Hx    Malignant hyperthermia Neg Hx    Colon cancer Neg Hx    Esophageal cancer Neg Hx    Stomach cancer Neg Hx    Colon polyps Neg Hx     Social History Social History   Tobacco Use   Smoking status: Former    Current packs/day: 0.00    Average packs/day: 1 pack/day for 15.0 years (15.0 ttl pk-yrs)    Types: Cigarettes    Start date: 07/01/1995    Quit date: 07/01/2010    Years since quitting: 12.6   Smokeless tobacco: Never  Vaping Use   Vaping status: Former  Substance Use Topics   Alcohol use: Yes    Comment: occasionally   Drug use: No     Allergies   Codeine   Review of Systems Review of Systems  Constitutional:  Positive for fever.  HENT:  Positive for congestion and ear pain.   Respiratory:  Positive for cough.   Musculoskeletal:  Positive for myalgias.     Physical Exam Triage Vital Signs ED Triage Vitals  Encounter Vitals Group     BP 03/08/23 0822 (!) 149/93     Systolic  BP Percentile --      Diastolic BP Percentile --      Pulse Rate 03/08/23 0822 94     Resp 03/08/23 0822 17     Temp 03/08/23 0822 (!) 101.6 F (38.7 C)     Temp Source 03/08/23 0822 Oral     SpO2 03/08/23 0822 95 %     Weight --      Height --      Head Circumference --      Peak Flow --      Pain Score 03/08/23 0821 7     Pain Loc --      Pain Education --      Exclude from Growth Chart --    No data found.  Updated Vital Signs BP (!) 149/93 (BP Location: Right Arm)   Pulse 94   Temp (!) 101.6 F (38.7 C) (Oral)   Resp 17   LMP  (Exact Date)   SpO2 95%   Visual Acuity Right Eye Distance:   Left Eye Distance:   Bilateral Distance:    Right Eye Near:   Left Eye Near:    Bilateral Near:     Physical Exam Vitals and nursing note reviewed.  Constitutional:      General: She is not in acute distress.    Appearance: She is well-developed. She is not ill-appearing.  HENT:     Head: Normocephalic and atraumatic.     Right Ear: Tympanic membrane and ear canal normal.     Left Ear: Tympanic membrane and ear canal normal.     Nose: Congestion present.     Mouth/Throat:     Mouth: Mucous membranes are moist.     Pharynx: Oropharynx is clear. Uvula midline. No oropharyngeal exudate or posterior oropharyngeal erythema.     Tonsils: No tonsillar exudate or tonsillar abscesses.  Eyes:     Conjunctiva/sclera: Conjunctivae normal.     Pupils: Pupils are equal, round, and reactive to light.  Cardiovascular:     Rate and Rhythm: Normal rate and regular rhythm.     Heart sounds: Normal heart sounds.  Pulmonary:     Effort: Pulmonary effort is normal.     Breath sounds:  Normal breath sounds.  Musculoskeletal:     Cervical back: Normal range of motion and neck supple.  Lymphadenopathy:     Cervical: No cervical adenopathy.  Skin:    General: Skin is warm and dry.  Neurological:     General: No focal deficit present.     Mental Status: She is alert and oriented to person,  place, and time.  Psychiatric:        Mood and Affect: Mood normal.        Behavior: Behavior normal.      UC Treatments / Results  Labs (all labs ordered are listed, but only abnormal results are displayed) Labs Reviewed  POCT INFLUENZA A/B - Abnormal; Notable for the following components:      Result Value   Influenza A, POC Positive (*)    All other components within normal limits    EKG   Radiology No results found.  Procedures Procedures (including critical care time)  Medications Ordered in UC Medications  acetaminophen  (TYLENOL ) tablet 650 mg (650 mg Oral Given 03/08/23 1914)    Initial Impression / Assessment and Plan / UC Course  I have reviewed the triage vital signs and the nursing notes.  Pertinent labs & imaging results that were available during my care of the patient were reviewed by me and considered in my medical decision making (see chart for details).     Patient given Tylenol  in clinic for fever.  Patient positive influenza A, start Tamiflu .  Promethazine  DM as needed for cough.  Continue Tylenol  and ibuprofen  as needed for fever management.  Encourage rest and fluids.  PCP follow-up if symptoms do not improve.  ER precautions reviewed. Final Clinical Impressions(s) / UC Diagnoses   Final diagnoses:  Acute cough  Influenza A     Discharge Instructions      Start Tamiflu  daily for 5 days.  You may take Promethazine  DM as needed for cough.  Please of this medication make you drowsy.  Do not drink alcohol or drive while on this medication.  Continue Tylenol  ibuprofen  as needed for fever management.  Lots of rest and fluids.  Please follow-up with your PCP if your symptoms do not improve.  Please go to the ER if you develop any worsening symptoms.  I hope you feel better soon!     ED Prescriptions     Medication Sig Dispense Auth. Provider   oseltamivir  (TAMIFLU ) 75 MG capsule Take 1 capsule (75 mg total) by mouth every 12 (twelve) hours. 10  capsule Shara Hartis, Jodi R, NP   promethazine -dextromethorphan (PROMETHAZINE -DM) 6.25-15 MG/5ML syrup Take 5 mLs by mouth 4 (four) times daily as needed for cough. 118 mL Lorain Keast, Jodi R, NP      PDMP not reviewed this encounter.   Alleen Arbour, NP 03/08/23 [phone]

## 2023-03-08 NOTE — ED Triage Notes (Addendum)
 Pt presents with body aches, cough and runny nose x 2 days. States she feels tired and has headaches. Reports pain in ribs when coughing and bilateral eye/ear pain.

## 2023-03-08 NOTE — Discharge Instructions (Signed)
 Start Tamiflu  daily for 5 days.  You may take Promethazine  DM as needed for cough.  Please of this medication make you drowsy.  Do not drink alcohol or drive while on this medication.  Continue Tylenol  ibuprofen  as needed for fever management.  Lots of rest and fluids.  Please follow-up with your PCP if your symptoms do not improve.  Please go to the ER if you develop any worsening symptoms.  I hope you feel better soon!

## 2023-03-28 ENCOUNTER — Ambulatory Visit: Admit: 2023-03-28 | Discharge: 2023-03-28 | Payer: BLUE CROSS/BLUE SHIELD | Attending: Family | Primary: Family

## 2023-03-28 DIAGNOSIS — Z0000 Encounter for general adult medical examination without abnormal findings: Secondary | ICD-10-CM

## 2023-03-28 NOTE — Progress Notes (Signed)
[facility]  [address]  [address]  [place] [address]  Dept: [phone]  Dept Fax: [phone]     Patient ID: Sierra Nichols is a 48 y.o. female who presents for Annual Exam (Patient is here for Re-establish for care/Concerns are Weight, heartburn and a persistent cough that she has had for the last year in a half/Wakes up in the morning and has persistent phlegm ).    Subjective   Has put 50 lbs of weight back on, notes worsening of her carpal tunnel syndrome, knee pain BL,. Two teenage daughters, increased stress past couple days. Stress eats. Late night eating. Frequent eating of unhealthy foods.  Highest weight was 314, down to 203 after sleeve.  Follows w [facility]. Has Mirena IUD happy w this method. No hot flashes or other bothersome perimenopausal symptoms.    Took bupropion previously for her anxiety/depression after loss of father.    Reflux worsened past year. Feels pain anytime she eats anything when present. Pain in upper abdomen, reflux on stomach contents /rare. Denies hematemesis. Taking OTC Nexium PRN with flares and this helps.    Persistent cough / postnasal drip in AM. Triggers from heat in car, nail salon. Intermittent with clear-green-yellow phlegm. +chronic throat clearing. Present since 12/2021. No HX asthma/ wheezing previously.      Patient Active Problem List   Diagnosis    Morbid obesity with BMI of 40.0-44.9, adult (Multi-HCC)    Lipoma of left axilla    S/P gastrectomy    Gastroesophageal reflux disease without esophagitis    Mixed anxiety depressive disorder     Current Outpatient Medications   Medication Instructions    esomeprazole (NEXIUM) 20 mg, oral, Daily before breakfast, Do not open capsule.    Mirena 52 mg, intrauterine     No Known Allergies  Past Medical History:   Diagnosis Date    BMI 39.0-39.9,adult 06/29/2020    BMI 40.0-44.9, adult (Multi-HCC)     Cholecystitis     Constipation 06/29/2020    Elevated blood pressure reading without  diagnosis of hypertension 10/29/2008    Formatting of this note might be different from the original.      Irregular menstrual cycle 06/29/2020    Lipoma     Low back pain radiating to left leg 11/06/2008    Formatting of this note might be different from the original.  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         Denies snoring.   Respiratory:  Positive for cough. Negative for apnea.    Gastrointestinal:  Negative for abdominal pain, blood in stool, constipation and diarrhea.   Genitourinary:  Negative for dyspareunia, menstrual problem, pelvic pain and vaginal discharge.   Skin:  Negative for rash.   Psychiatric/Behavioral:  Positive for dysphoric mood and sleep disturbance (racing thoughts.). The patient is nervous/anxious.        Objective   Visit Vitals  BP 126/76  Pulse 73   Temp 36.1 C (97 F) (Temporal)   Resp 16   Ht 1.651 m   Wt 115.2 kg Comment: 253.9lbs   SpO2 96%   BMI 42.25 kg/m   OB Status Implant   BSA 2.3 m     Physical Exam  Constitutional:       General: She is not in acute distress.     Appearance: Normal appearance. She is not toxic-appearing.   HENT:      Head: Normocephalic and atraumatic.      Right Ear: Tympanic membrane, ear canal and external ear normal.      Left Ear: Tympanic membrane, ear canal and external ear normal.      Nose: Nose normal.      Mouth/Throat:      Mouth: Mucous membranes are moist.      Pharynx: Oropharynx is clear. No oropharyngeal exudate or posterior oropharyngeal erythema.   Eyes:      General: No scleral icterus.        Right eye: No discharge.          Left eye: No discharge.      Extraocular Movements: Extraocular movements intact.      Conjunctiva/sclera: Conjunctivae normal.   Cardiovascular:      Rate and Rhythm: Normal rate and regular rhythm.      Heart sounds: Normal heart sounds. No murmur heard.     No friction rub. No gallop.   Pulmonary:      Effort: Pulmonary effort is normal. No respiratory distress.      Breath sounds: Normal breath sounds. No wheezing, rhonchi or rales.   Chest:   Breasts:     Right: Normal. No mass, skin change or tenderness.      Left: Normal. No mass, skin change or tenderness.   Abdominal:      General: Bowel sounds are normal. There is no distension.      Palpations: Abdomen is soft. There is no mass.      Tenderness: There is no abdominal tenderness. There is no guarding or rebound.      Hernia: No hernia is present.   Genitourinary:     Exam position: Lithotomy position.      Labia:         Right: No rash, tenderness or lesion.         Left: No rash, tenderness or lesion.       Vagina: Normal. No vaginal discharge or tenderness.      Cervix: No cervical motion tenderness, discharge or lesion.      Uterus: Normal. Not enlarged and not tender.       Adnexa: Right adnexa normal and left adnexa normal.        Right: No mass, tenderness or fullness.          Left: No mass, tenderness or fullness.        Comments: IUD strings at os.  Musculoskeletal:         General: Normal range of motion.      Cervical back: Normal range of motion and neck supple. No rigidity or tenderness.      Right lower leg: No edema.      Left lower leg: No edema.   Lymphadenopathy:      Cervical: No cervical adenopathy.      Upper Body:      Right upper body: No supraclavicular, axillary or pectoral adenopathy.      Left upper  body: No supraclavicular, axillary or pectoral adenopathy.   Skin:     General: Skin is warm.      Findings: No lesion or rash.      Comments: 8 cm x 8 cm lipoma R mid back   Neurological:      General: No focal deficit present.       Mental Status: She is alert. Mental status is at baseline.      Gait: Gait normal.   Psychiatric:         Behavior: Behavior normal.         Assessment/Plan   Renna was seen today for annual exam.  Routine general medical examination at a health care facility  Comments:  Due for Pap, mammo and colo. Pap done today. Mammo /colo ordered. IZ reviewed/ declined flu shot.  Orders:  -     CBC; Future  -     Comprehensive metabolic panel; Future  -     Lipid panel; Future  -     Hemoglobin A1c; Future  -     IRON, FERRITIN, AND TIBC; Future  -     Vitamin B12/Folate Serum Panel; Future  -     TSH; Future  -     BI BILATERAL MAMMOGRAM SCREENING TOMOSYNTHESIS; Future  -     HIV Ab/Ag screen; Future  -     Syphilis: RPR with reflex titer (monitor treatment response); Future  -     Hepatitis C antibody; Future  -     GC/CT (Amp DNA)  -     Pap Smear + Aptima HPV w/ Rflx to HPV Genotypes 16/18, 45  Morbid obesity with BMI of 40.0-44.9, adult (Multi-HCC)  Comments:  Reviewed healthy lifestyle.  Orders:  -     CBC; Future  -     Comprehensive metabolic panel; Future  -     Lipid panel; Future  -     Hemoglobin A1c; Future  -     IRON, FERRITIN, AND TIBC; Future  -     Vitamin B12/Folate Serum Panel; Future  -     TSH; Future  -     [facility]; Future  Gastroesophageal reflux disease without esophagitis  Comments:  Reviewed healthy lifestyle, continue PPI PRN, indications for sooner reeval reviewed.  Orders:  -     [facility]ulatory referral to [facility]; Future  S/P gastrectomy  -     CBC; Future  -     Comprehensive metabolic panel; Future  -     Lipid panel; Future  -     Hemoglobin A1c; Future  -     IRON, FERRITIN, AND TIBC; Future  -     Vitamin B12/Folate Serum Panel; Future  -     TSH; Future  -     Ambulatory referral to [facility]; Future  Screening for colon cancer  -     EXT  Gastroenterology Referral; Future  Lipoma of torso  -     INT  [facility] [place]; Future  Chronic cough  Comments:  Reflux VS allergies. She will start loratadine PRN, RTO if no improvement.  Orders:  -     XR CHEST 2 VIEWS; Future  Mixed anxiety depressive disorder  Comments:  I provided her w info for counseling services [facility]. Consider restarting meds if no improvement w counseling. Seek immediate care if SI.

## 2023-04-01 LAB — GC/CT (AMP DNA)
C trach NAA: NEGATIVE
N gonorrhoeae NAA: NEGATIVE

## 2023-04-01 LAB — PAP SMEAR + APTIMA HPV W/ RFLX TO HPV GENOTYPES 16/18, 45: HPV Aptima: NEGATIVE

## 2023-04-07 LAB — CBC
Hct: 41.5 % (ref 34.0–46.6)
Hgb: 13.3 g/dL (ref 11.1–15.9)
MCH: 27.3 pg (ref 26.6–33.0)
MCHC: 32 g/dL (ref 31.5–35.7)
MCV: 85 fL (ref 79–97)
Platelets: 245 10*3/uL (ref 150–450)
RBC: 4.87 x10E6/uL (ref 3.77–5.28)
RDW: 13.2 % (ref 11.7–15.4)
WBC: 7.8 10*3/uL (ref 3.4–10.8)

## 2023-04-07 LAB — COMPREHENSIVE METABOLIC PANEL
ALT: 28 IU/L (ref 0–32)
AST: 21 IU/L (ref 0–40)
Albumin: 4.1 g/dL (ref 3.9–4.9)
Alk Phosphatase: 75 IU/L (ref 44–121)
Anion Gap: 13 mmol/L (ref 10.0–18.0)
BUN/Creat Ratio: 19 (ref 9–23)
BUN: 12 mg/dL (ref 6–24)
Bili Total: 0.3 mg/dL (ref 0.0–1.2)
Calcium: 9.2 mg/dL (ref 8.7–10.2)
Carbon Dioxide: 22 mmol/L (ref 20–29)
Chloride: 104 mmol/L (ref 96–106)
Creat: 0.62 mg/dL (ref 0.57–1.00)
Globulin Total: 2.7 g/dL (ref 1.5–4.5)
Glucose: 92 mg/dL (ref 70–99)
Potassium: 4.4 mmol/L (ref 3.5–5.2)
Protein Total: 6.8 g/dL (ref 6.0–8.5)
Sodium: 139 mmol/L (ref 134–144)
eGFR: 110 mL/min/{1.73_m2} (ref >59)

## 2023-04-07 LAB — IRON, FERRITIN, TIBC
Ferritin: 275 ng/mL — ABNORMAL HIGH (ref 15–150)
Iron Bind.Cap.(TIBC): 255 ug/dL (ref 250–450)
Iron Saturation: 32 % (ref 15–55)
Iron: 82 ug/dL (ref 27–159)
UIBC: 173 ug/dL (ref 131–425)

## 2023-04-07 LAB — HIV AB/AG SCREEN: HIV Scr 4th Gen: NONREACTIVE

## 2023-04-07 LAB — VITAMIN B12/FOLATE SERUM PANEL
Folate: >20 ng/mL (ref >3.0)
Vitamin B12: 481 pg/mL (ref 232–1245)

## 2023-04-07 LAB — LIPID PANEL
Cholesterol: 250 mg/dL — ABNORMAL HIGH (ref 100–199)
HDL Cholesterol: 62 mg/dL (ref >39)
LDLc Calc (NIH): 175 mg/dL — ABNORMAL HIGH (ref 0–99)
Non-HDL Chol: 188 mg/dL — ABNORMAL HIGH (ref 0–129)
Triglycerides: 79 mg/dL (ref 0–149)
VLDLc Calc: 13 mg/dL (ref 5–40)

## 2023-04-07 LAB — HEMOGLOBIN A1C: HgbA1C: 5.2 % (ref 4.8–5.6)

## 2023-04-07 LAB — HCV AB W/REFLEX QUANT PCR (SCREENING): Hep C Virus Ab: NONREACTIVE

## 2023-04-07 LAB — TSH: TSH: 3.4 u[IU]/mL (ref 0.450–4.500)

## 2023-04-07 LAB — SYPHILIS: RPR WITH REFLEX TITER (MONITOR TREATMENT RESPONSE): RPR: NONREACTIVE

## 2023-05-09 ENCOUNTER — Inpatient Hospital Stay: Admit: 2023-05-09 | Payer: PRIVATE HEALTH INSURANCE | Primary: Family

## 2023-05-09 DIAGNOSIS — Z0000 Encounter for general adult medical examination without abnormal findings: Secondary | ICD-10-CM

## 2023-05-09 DIAGNOSIS — Z1231 Encounter for screening mammogram for malignant neoplasm of breast: Secondary | ICD-10-CM

## 2023-08-14 ENCOUNTER — Ambulatory Visit
Admit: 2023-08-14 | Discharge: 2023-08-14 | Payer: PRIVATE HEALTH INSURANCE | Attending: Adult Health | Primary: Family Medicine

## 2023-08-14 DIAGNOSIS — E66813 Obesity, class 3: Principal | ICD-10-CM

## 2023-08-14 DIAGNOSIS — Z6841 Body Mass Index (BMI) 40.0 and over, adult: Secondary | ICD-10-CM

## 2023-08-14 MED ORDER — tirzepatide, weight loss, (Zepbound) 2.5 mg/0.5 mL solution
2.5 | SUBCUTANEOUS | 0 refills | 28.00000 days | Status: DC
Start: 2023-08-14 — End: 2023-09-11

## 2023-08-14 NOTE — Telephone Encounter (Signed)
 Patient calling the office today to explain that her health insurance has a plan exclusion in place for all weight loss medications, she states she was told about the option of getting the medication through [facility] and would like to go this route.

## 2023-08-14 NOTE — Progress Notes (Unsigned)
 Sleep study diagnosed w/sleep apnea :   No  Overnight sleep study past 2 yrs neg OSA :   No.  Location : N/A  Was cpap/bipap ordered :N/A

## 2023-08-14 NOTE — H&P (Cosign Needed)
 H&P      Today's Date: 08/14/2023  MRN: [id number]  Name: Sierra Nichols  DOB: 03/13/1975    Chief Complaint  Weight Management    History Of Present Illness  Sierra Nichols is a 48 y.o. female presenting with obesity for initial medical wt loss consultation. She is also is 6 years s/p sleeve with Dr. Debora. Lost to f/u at our clinic since 2020. Wt today 257.7lbs BMI 42.90, preop wt 312lbs, reports lowest wt was ~203lbs in 2020. She states she has since been slowly regaining weight back d/t emotional eating. Also recognizes that she eats late at night. +family hx of obesity.  She also has complaints of gerd sxs, needing to taking prilosec OTC at time. +caffeine daily, +1-2 seltzers weekly, ETOH 1-3 a week. She is not interested in discussing a sleeve to RYGB conversion at this time  Admits to being noncompliant with vitamins  Exercise: none     Past Medical History  She has a past medical history of BMI 39.0-39.9,adult (06/29/2020), BMI 40.0-44.9, adult (CMS-HCC), Cholecystitis, Constipation (06/29/2020), Depression, Elevated blood pressure reading without diagnosis of hypertension (10/29/2008), GERD (gastroesophageal reflux disease), Hyperlipidemia, Irregular menstrual cycle (06/29/2020), Lipoma, Low back pain radiating to left leg (11/06/2008), Refraction error, and S/P gastrectomy.  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     Social History  She reports that she has never smoked. She has never been exposed to tobacco smoke. She has never used smokeless tobacco. She reports current alcohol use. She reports that she does not use drugs.    Family History  Family History[1]     Advance Care Plan  Extended Emergency Contact Information  Primary Emergency Contact: Fults,Steven  Mobile Phone: [phone]  Relation: Spouse  No Order           Allergies  Patient has  no known allergies.     Medications    Current Outpatient Medications:     esomeprazole (NexIUM) 20 mg DR capsule, Take 20 mg by mouth before breakfast. Do not open capsule., Disp: , Rfl:     levonorgestrel (Mirena) 20 mcg/24 hours (7 yrs) 52 mg IUD, 52 mg by intrauterine route., Disp: , Rfl:           Review of Systems    Objective   ENT-wnl  Cardiovascular-  wnl  Respiratory- wnl  GI- wnl  GU-leaking of urine when coughing/sneezing  Women's Health-wnl    Physical Exam  Pt appears comfortable and in no acute distress. Respirations regular and unlabored.   Physical Exam  Vitals reviewed.   Constitutional:       Appearance: Normal appearance.   Pulmonary:      Effort: Pulmonary effort is normal.   Skin:     General: Skin is warm.   Neurological:      General: No focal deficit present.      Mental Status: Oriented to person, place, and time. Mental status is at baseline.   Psychiatric:         Mood and Affect: Mood normal.         Behavior: Behavior normal.         Thought Content: Thought content normal.         Judgment: Judgment normal  Last Recorded Vitals  Blood pressure 124/84, pulse 64, temperature 36.1  C (96.9 F), temperature source Temporal, resp. rate 19, height 1.651 m, weight 116.9 kg, SpO2 96%.    Relevant Results       Assessment/Plan   1. Class 3 severe obesity due to excess calories with body mass index (BMI) of 40.0 to 44.9 in adult, unspecified whether serious comorbidity present (Primary)  I discussed with patient the difficulty surrounding obtaining a glp1 and the medication needing to potentially be lifelong with limited long term research and data.   I discussed that the 20-30% TBWL expected is in conjunction with working with an RD  Educated that medical wt loss is only a weight loss tool, still need to eat healthy and remain physically active.   Discussed setting up realistic goals     Will order and try to have patient start on Zepbound - not covered by insurance, will send to [facility]  --Pt denies any hx pancreatitis. Denies personal/family hx of multiple endocrine neoplasia syndrome and or medullary thyroid ca  --baseline labs done and in EMR  --Pt is aware we may need to obtain PA, medication may not be approved by insurance and or may need to be in a weight loss program for 3-6 months prior to being approved, if we receive a denial from their insurance company we will not appeal the denial. They may need to call around to see what pharmacy has medication in stock. May not be able to titrate up d/t medication shortage.   --Pt is aware (s)he needs to stop medication 1 week prior to surgery. Aware of the potential risk of gastroparesis- UpToDate handout given  -- UpToDate handout on medication given; pt is aware of potential side effects such as GI upset, gerd, fatigue, hair loss, kidney problems, gallbladder problems, pancrease problems, dizziness, fast heart rate, change in eyesight, low blood sugar, etc.     - tirzepatide , weight loss, (Zepbound ) 2.5 mg/0.5 mL solution; Inject 2.5 mg under the skin every 7 (seven) days.  Dispense: 2 mL; Refill: 0    2. Status post bariatric surgery  Does not want to discuss sleeve to RYGB at this time  Encouraged to avoid nicotine, nsaids, carbonated drinks, caffeine and or etoh as these increase your risk of gastritis/ulcers s/p bariatric surgery     - tirzepatide , weight loss, (Zepbound ) 2.5 mg/0.5 mL solution; Inject 2.5 mg under the skin every 7 (seven) days.  Dispense: 2 mL; Refill: 0    3. Exercise counseling  Goal weekly of moderate intensity exercise, twice weekly of resistance training to prevent lean muscle mass loss   Encouraged to get and use smart scale so we can track fat% and muscle mass     - tirzepatide , weight loss, (Zepbound ) 2.5 mg/0.5 mL solution; Inject 2.5 mg under the skin every 7 (seven) days.  Dispense: 2 mL; Refill: 0    4. Dietary counseling  RD appt made  Reviewed with patient having 4-6 small meals daily  instead of 2-3 large meals d/t medication slowing down your digestive track. Fatty and fried food will also be harder to tolerate because of this and should be limited. Have more soluable fiber vs insoluable  Even if you are not hungry you still need to sit down and have a few bites to eat as food is fuel. Medication is meant to be used to eat a smaller amt of a well balanced diet.    - tirzepatide , weight loss, (Zepbound ) 2.5 mg/0.5 mL solution; Inject 2.5 mg under  the skin every 7 (seven) days.  Dispense: 2 mL; Refill: 0    5. Anxiety and depression   Briefly discussed the Psychology of eating with patient  [facility] appt made    - tirzepatide , weight loss, (Zepbound ) 2.5 mg/0.5 mL solution; Inject 2.5 mg under the skin every 7 (seven) days.  Dispense: 2 mL; Refill: 0    6. Gastroesophageal reflux disease, unspecified whether esophagitis present  Encouraged to permanently dc caffeine and or carbonated drink intake.   Decrease ETOH intake  Take Prilosec OTC daily until you are able to complete above  Discussed adherence to reflux diet: avoid caffeine, alcohol, chocolate, spicy foods, citrus fruits, tomato sauces, carbonated beverage, fatty foods, lactose, etc. Avoid very hot foods to not irritate esophagus or gastric lining. Also, instructed patient to not lie down for at least 2-3 hours after eating and elevate the head of bed or use a pillow wedge if symptoms happen at night. Instructed to not skip breakfast, eat smaller meal portions every 3-4 hours, and avoid overeating.      - tirzepatide , weight loss, (Zepbound ) 2.5 mg/0.5 mL solution; Inject 2.5 mg under the skin every 7 (seven) days.  Dispense: 2 mL; Refill: 0    7. Vitamin deficiency  Encouraged to at least take a MVI with iron a lunch and 1 ca citrate chew with dinner and or bedtime    - tirzepatide , weight loss, (Zepbound ) 2.5 mg/0.5 mL solution; Inject 2.5 mg under the skin every 7 (seven) days.  Dispense: 2 mL; Refill: 0    8. Counseling in natural family  planning to avoid pregnancy  Pt is aware and agrees to delay of conceiving while she is on this medication. Discussed rationale with patient. Discussed with patient that some studies are start to show that this medication may effect birth control PILLS  If she finds out she is pregnant she needs to immediately stop medication  Has IUD    - tirzepatide , weight loss, (Zepbound ) 2.5 mg/0.5 mL solution; Inject 2.5 mg under the skin every 7 (seven) days.  Dispense: 2 mL; Refill: 0       Leita Music, NP                         [1]   Family History  Problem Relation Name Age of Onset    Hypertension Mother      Obesity Mother      Brain Aneurysm Father      Obesity Maternal Grandmother      Breast cancer Maternal Grandmother      Adjustment Disorder with Mixed Anxiety and Depressed Mood Daughter      Obesity Sister      Obesity Maternal Grandfather      Colon cancer Neg Hx      Colon polyps Neg Hx

## 2023-08-23 ENCOUNTER — Ambulatory Visit
Admit: 2023-08-23 | Discharge: 2023-08-23 | Payer: PRIVATE HEALTH INSURANCE | Attending: Psychologist | Primary: Family Medicine

## 2023-08-23 DIAGNOSIS — F4322 Adjustment disorder with anxiety: Principal | ICD-10-CM

## 2023-08-23 NOTE — Progress Notes (Signed)
 Outpatient Treatment Plan  [facility]  [address]  [place] [address]    Patient's SNAP Assessment    Strengths-motivation for weight loss, ability to learn, positive plans for the future    Needs-healthy lifestyle education, positive coping skills    Abilities-motivated for psychotherapy, ability to learn,    Preferences-individual psychotherapy    Treatment goals-    Goal 1-help patient locate individual psychotherapy outside of this facility.    Target date-1 month    Intervention-patient was provided resources to help locate psychotherapy including referral to [employer]'s find a therapist website, the Multidisciplinary Eating [employer]  and a list of local clinics that will take multiple insurances including [employer]    Responsible clinician-Leora Platt O'Brien, EDD    Goal 2-continue evaluation for bariatric surgery or medical weight loss treatment    Target date-3 months    Intervention-patient will be provided medical support and dietary education by staff of [facility]    Responsible clinicians-[facility]    Attestation-I certify that the patient and members of the treatment team participated in the formulation of this treatment plan.  I certify that the treatment plan is medically necessary and has been prescribed by me for the treatment of the identified problems, to improve or maintain functioning and to prevent relapse and hospitalization
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
## 2023-08-30 ENCOUNTER — Ambulatory Visit: Admit: 2023-08-30 | Discharge: 2023-08-30 | Payer: PRIVATE HEALTH INSURANCE | Primary: Family Medicine

## 2023-08-30 DIAGNOSIS — E66813 Obesity, class 3: Principal | ICD-10-CM

## 2023-08-30 NOTE — Progress Notes (Signed)
 [facility]   Nutrition Assessment--Medical  Initial appointment      Sierra Nichols presents to clinic for initial nutrition appointment.      SUBJECTIVE / OBJECTIVE     Pt seeks nutrition counseling for weight loss, keeping it off, and learning to manage stress without food. Put 50# back on after sleeve and wants to get back on rythm and get back on track.     Related medical history: Sierra Nichols has a past medical history of  has a past medical history of BMI 39.0-39.9,adult (06/29/2020), BMI 40.0-44.9, adult (CMS-HCC), Cholecystitis, Constipation (06/29/2020), Depression, Elevated blood pressure reading without diagnosis of hypertension (10/29/2008), GERD (gastroesophageal reflux disease), Hyperlipidemia, Irregular menstrual cycle (06/29/2020), Lipoma, Low back pain radiating to left leg (11/06/2008), Refraction error, and S/P gastrectomy.   6 years s/p sleeve w/ Dr. Liddie in 2019    Anti obesity medication: started two doses of zepbound  and less cravings.     Anthropometrics:  Current weight: 251.6#  Height: 5' 5  BMI: 41.9     Social history: Married to Sierra Nichols and she has 2 daughters from previous marriage and Sierra Nichols has 3 kids, still in the house. Works for a Sales executive.     Dieting History:  Max weight loss: 111#, lowest was 203#  Modalities used: sleeve surgery  Highest weight: 314#  Amount of weight pt would like to lose: to be under 200# and maintain    Typical Daily Intake: trying to eat 3/ day and back to meal prepping and tracking on MFP     Breakfast Corepower Elite 42 gm shake   Or if at home 1-2 eggs    Snack  none   Lunch Some cheese and salami or veggies and dip or cheese and crackers or 100 kcal pack nuts   Snack     Supper Usually protein and veggies and sometimes carb  Last night was meat, zucchini and potatoes    Snacks   Cool whip and graham crackers and blueberries        Beverages: aims for 80 oz per day plain water and sometimes adds electrolyte  powder   Caffeine: 1 cup or less coffee w/ raw sugar and a little cream (cutting down as was causing heartburn)  Soda: none  Juice: none  ETOH: occasional, just one tequila and soda last night  Still separating eating and drinking     How Often Meals Eaten or Prepared Away From Home: Was 2-3 times per week but not recently.      Physical Activity: this week has been doing a lot of gardening and using weights and riding stationary bike for 30 minutes per day    Vitamins; Centrum MVI and B complex once a day in the morning and 2 capsules calcium (600 mg) at lunch and 2 at dinner     ASSESSMENT:     Sierra Nichols has been referred for nutrition evaluation and diet instruction for general nutrition counseling (non surgical) weight loss.          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         Intervention:   Patient is counseled on weight loss and nutrition therapy. Discussed appropriate portion sizes. Discussed exercise recommendations. Discussed eating protein based meals.      Sierra Nichols was counseled on creating a calorie deficit by making dietary and physical activity changes that are sustainable in  the long term. We discussed a healthful, balanced meal pattern with reduced-size portions and ad lib vegetables consisting of >50% of the diet. The patient was counseled on recommended portions, keeping daily food diary, and engaging in moderate physical exercise for 30 min 5 times a week (150 min/week) or more.  The patient was also counseled on:   Continuing small, frequent high protein meals and snacks and aiming for about 1 cup portions at meals.  Chewing well and slowing down while eating.  Continuing to cut down on caffeine.  Staying active with stationary bike and weights.    The patient was provided post op meal plan consistent with current diet stage       PLAN:     Pt agrees to:  --increase fiber and protein at meals  --consume consistent meals and snacks every 2-3  hours  --measure foods consumed- follow bariatric myplate guidelines  --engage in daily physical activity/exercise program (150 min/week)  The patient verbalized agreement and understanding.  All patient questions were answered.   We plan to follow-up in 2 months.  Sierra Nichols, RD RDN    Total minutes spent: 18 minutes

## 2023-09-11 ENCOUNTER — Ambulatory Visit
Admit: 2023-09-11 | Discharge: 2023-09-11 | Payer: PRIVATE HEALTH INSURANCE | Attending: Adult Health | Primary: Family Medicine

## 2023-09-11 ENCOUNTER — Encounter (HOSPITAL_COMMUNITY): Payer: Self-pay

## 2023-09-11 ENCOUNTER — Ambulatory Visit (HOSPITAL_COMMUNITY)
Admission: RE | Admit: 2023-09-11 | Discharge: 2023-09-11 | Disposition: A | Discharge status: Home / Self Care | Source: Ambulatory Visit | Attending: Internal Medicine | Admitting: Internal Medicine

## 2023-09-11 VITALS — BP 162/104 | HR 93 | Temp 98.7°F | Resp 16

## 2023-09-11 DIAGNOSIS — R030 Elevated blood-pressure reading, without diagnosis of hypertension: Secondary | ICD-10-CM | POA: Insufficient documentation

## 2023-09-11 DIAGNOSIS — R197 Diarrhea, unspecified: Secondary | ICD-10-CM | POA: Insufficient documentation

## 2023-09-11 DIAGNOSIS — Z6841 Body Mass Index (BMI) 40.0 and over, adult: Principal | ICD-10-CM

## 2023-09-11 DIAGNOSIS — E66813 Obesity, class 3: Principal | ICD-10-CM

## 2023-09-11 LAB — CBC
HCT: 44.1 % (ref 36.0–46.0)
Hemoglobin: 15.2 g/dL — ABNORMAL HIGH (ref 12.0–15.0)
MCH: 33.9 pg (ref 26.0–34.0)
MCHC: 34.5 g/dL (ref 30.0–36.0)
MCV: 98.4 fL (ref 80.0–100.0)
Platelets: 173 K/uL (ref 150–400)
RBC: 4.48 MIL/uL (ref 3.87–5.11)
RDW: 11.9 % (ref 11.5–15.5)
WBC: 6.5 K/uL (ref 4.0–10.5)
nRBC: 0 % (ref 0.0–0.2)

## 2023-09-11 LAB — COMPREHENSIVE METABOLIC PANEL WITH GFR
ALT: 76 U/L — ABNORMAL HIGH (ref 0–44)
AST: 94 U/L — ABNORMAL HIGH (ref 15–41)
Albumin: 4.7 g/dL (ref 3.5–5.0)
Alkaline Phosphatase: 107 U/L (ref 38–126)
Anion gap: 13 (ref 5–15)
BUN: 6 mg/dL (ref 6–20)
CO2: 21 mmol/L — ABNORMAL LOW (ref 22–32)
Calcium: 10.2 mg/dL (ref 8.9–10.3)
Chloride: 103 mmol/L (ref 98–111)
Creatinine, Ser: 0.77 mg/dL (ref 0.44–1.00)
GFR, Estimated: >60 mL/min (ref >60)
Glucose, Bld: 86 mg/dL (ref 70–99)
Potassium: 3.7 mmol/L (ref 3.5–5.1)
Sodium: 137 mmol/L (ref 135–145)
Total Bilirubin: 1.3 mg/dL — ABNORMAL HIGH (ref 0.0–1.2)
Total Protein: 8.3 g/dL — ABNORMAL HIGH (ref 6.5–8.1)

## 2023-09-11 MED ORDER — DIPHENOXYLATE-ATROPINE 2.5-0.025 MG PO TABS: 1.0000 | ORAL_TABLET | Freq: Four times a day (QID) | ORAL | 0 refills | Status: AC | PRN

## 2023-09-11 MED ORDER — AMLODIPINE BESYLATE 5 MG PO TABS: 5.0000 mg | ORAL_TABLET | Freq: Every day | ORAL | 0 refills | Status: AC

## 2023-09-11 MED ORDER — SODIUM CHLORIDE 0.9 % IV BOLUS
500.0000 mL | Freq: Once | INTRAVENOUS | Status: AC
  Administered 2023-09-11: 500 mL via INTRAVENOUS

## 2023-09-11 MED ORDER — tirzepatide, weight loss, (Zepbound) 10 mg/0.5 mL syringe
10 | SUBCUTANEOUS | 2 refills | 28.00000 days | Status: DC
Start: 2023-09-11 — End: 2023-09-11

## 2023-09-11 MED ORDER — tirzepatide, weight loss, (Zepbound) 10 mg/0.5 mL solution
10 | SUBCUTANEOUS | 2 refills | 28.00000 days | Status: DC
Start: 2023-09-11 — End: 2023-09-29

## 2023-09-11 NOTE — ED Triage Notes (Signed)
 Patient reports intermittent diarrhea x 1 month. Patient denies any abdominal pain. 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  Patient states she has not taken anything for diarrhea because she did not want to get constipated.

## 2023-09-11 NOTE — ED Provider Notes (Signed)
 [facility]    CSN: [id number] Arrival date & time: 09/11/23  1245      History   Chief Complaint Chief Complaint  Patient presents with   Diarrhea    Very dehydrated and would like blood work done as I am extremely fatigued as well - Entered by patient    HPI Carol Pope is a 48 y.o. female.   48 year old female who presents urgent care with complaints of diarrhea for a month..  She reports that she actually has had intermittent constipation and diarrhea for much longer than this.  She reports this started shortly after she had her last child but relates that it has not lasted this long.  She did have a colonoscopy in 2023 which did not show any acute abnormalities per the patient.  She denies any fevers, abdominal pain, nausea or vomiting.  She is able to eat and drink but feels like it goes straight through her.  She reports that she feels extremely fatigued and feels like she is dehydrated.  She feels like she cannot keep fluids in her.  She has not taken any medication for diarrhea because she does not want to get constipated.  She does not have a primary care physician but does have a gastroenterologist.  She sees her gynecologist for her primary care needs.  She has been told that her blood pressure runs on the high side but has never been started on any medication.  She denies any chest pain or shortness of breath.  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   Diarrhea Associated symptoms: no abdominal pain, no arthralgias, no chills, no fever and no vomiting     Past Medical History:  Diagnosis Date   Abnormal Pap smear    Anxiety    Discoid lupus     There are no active problems to display for this patient.   Past Surgical History:  Procedure Laterality Date   CHOLECYSTECTOMY     GYNECOLOGIC CRYOSURGERY      OB History     Gravida  2   Para  1   Term  1   Preterm  0   AB  1   Living  1       SAB  1   IAB  0   Ectopic  0   Multiple  0   Live Births  1            Home Medications    Prior to Admission medications   Medication Sig Start Date End Date Taking? Authorizing Provider  amLODipine  (NORVASC ) 5 MG tablet Take 1 tablet (5 mg total) by mouth daily. 09/11/23  Yes Advay Volante A, PA-C  diphenoxylate -atropine  (LOMOTIL ) 2.5-0.025 MG tablet Take 1 tablet by mouth 4 (four) times daily as needed for diarrhea or loose stools. 09/11/23  Yes Amarachukwu Lakatos A, PA-C  ALPRAZolam (XANAX) 0.5 MG tablet Take 1 tablet by mouth as needed. 05/05/21   [provider]  azithromycin  (ZITHROMAX ) 250 MG tablet Take 1 tablet (250 mg total) by mouth daily. Take first 2 tablets together, then 1 every day until finished. 04/08/22   Curatolo, Adam, DO  benzonatate  (TESSALON ) 100 MG capsule Take 1 capsule (100 mg total) by mouth every 8 (eight) hours. 04/08/22   Curatolo, Adam, DO  dicyclomine  (BENTYL ) 10 MG capsule Take 1 capsule (10 mg total) by mouth 3 (three) times daily as needed for spasms.  Patient not taking: Reported on 06/24/2021 05/17/21   Dorsey, Ying C, MD  escitalopram (LEXAPRO) 10 MG tablet Take 10 mg by mouth daily. 05/05/21   [provider]  hydroxychloroquine (PLAQUENIL) 200 MG tablet Take 200 mg by mouth daily. Patient not taking: Reported on 09/11/2023 03/11/21   [provider]  oseltamivir  (TAMIFLU ) 75 MG capsule Take 1 capsule (75 mg total) by mouth every 12 (twelve) hours. 03/08/23   Mayer, Jodi R, NP  promethazine -dextromethorphan (PROMETHAZINE -DM) 6.25-15 MG/5ML syrup Take 5 mLs by mouth 4 (four) times daily as needed for cough. 03/08/23   Mayer, Jodi R, NP  Wheat Dextrin (BENEFIBER) POWD Take 1 Dose by mouth daily as needed. 05/17/21   Federico Rosario BROCKS, MD    Family History Family History  Problem Relation Age of Onset   Birth defects Father        hole in heart   Diabetes Father    Pernicious anemia Father    Drug abuse Father         prescription pain killers   Birth defects Sister        hole in heart   Other Sister        auto immune disease   Anesthesia problems Neg Hx    Hypotension Neg Hx    Pseudochol deficiency Neg Hx    Malignant hyperthermia Neg Hx    Colon cancer Neg Hx    Esophageal cancer Neg Hx    Stomach cancer Neg Hx    Colon polyps Neg Hx     Social History Social History   Tobacco Use   Smoking status: Former    Current packs/day: 0.00    Average packs/day: 1 pack/day for 15.0 years (15.0 ttl pk-yrs)    Types: Cigarettes    Start date: 07/01/1995    Quit date: 07/01/2010    Years since quitting: 13.2   Smokeless tobacco: Never  Vaping Use   Vaping status: Former  Substance Use Topics   Alcohol use: Yes    Comment: occasionally   Drug use: No     Allergies   Codeine   Review of Systems Review of Systems  Constitutional:  Negative for chills and fever.  HENT:  Negative for ear pain and sore throat.   Eyes:  Negative for pain and visual disturbance.  Respiratory:  Negative for cough and shortness of breath.   Cardiovascular:  Negative for chest pain and palpitations.  Gastrointestinal:  Positive for diarrhea. Negative for abdominal pain and vomiting.  Genitourinary:  Negative for dysuria and hematuria.  Musculoskeletal:  Negative for arthralgias and back pain.  Skin:  Negative for color change and rash.  Neurological:  Negative for seizures and syncope.  All other systems reviewed and are negative.    Physical Exam Triage Vital Signs ED Triage Vitals [09/11/23 1352]  Encounter Vitals Group     BP (!) 158/107     Girls Systolic BP Percentile      Girls Diastolic BP Percentile      Boys Systolic BP Percentile      Boys Diastolic BP Percentile      Pulse Rate 93     Resp 16     Temp 98.7 F (37.1 C)     Temp Source Oral     SpO2 97 %     Weight      Height      Head Circumference      Peak Flow  Pain Score 0     Pain Loc      Pain Education      Exclude  from Growth Chart    No data found.  Updated Vital Signs BP (!) 158/107 (BP Location: Left Arm)   Pulse 93   Temp 98.7 F (37.1 C) (Oral)   Resp 16   SpO2 97%   Visual Acuity Right Eye Distance:   Left Eye Distance:   Bilateral Distance:    Right Eye Near:   Left Eye Near:    Bilateral Near:     Physical Exam Vitals and nursing note reviewed.  Constitutional:      General: She is not in acute distress.    Appearance: She is well-developed.  HENT:     Head: Normocephalic and atraumatic.     Nose: Nose normal.     Mouth/Throat:     Mouth: Mucous membranes are moist.     Pharynx: No posterior oropharyngeal erythema.  Eyes:     Conjunctiva/sclera: Conjunctivae normal.     Pupils: Pupils are equal, round, and reactive to light.  Cardiovascular:     Rate and Rhythm: Normal rate and regular rhythm.     Pulses: Normal pulses.     Heart sounds: No murmur heard. Pulmonary:     Effort: Pulmonary effort is normal. No respiratory distress.     Breath sounds: Normal breath sounds.  Abdominal:     General: Bowel sounds are normal. There is no distension.     Palpations: Abdomen is soft.     Tenderness: There is no abdominal tenderness. There is no guarding.     Hernia: No hernia is present.  Musculoskeletal:        General: No swelling.     Cervical back: Neck supple.  Skin:    General: Skin is warm and dry.     Capillary Refill: Capillary refill takes less than 2 seconds.  Neurological:     General: No focal deficit present.     Mental Status: She is alert.  Psychiatric:        Mood and Affect: Mood normal.        Behavior: Behavior normal.        Thought Content: Thought content normal.        Judgment: Judgment normal.      UC Treatments / Results  Labs (all labs ordered are listed, but only abnormal results are displayed) Labs Reviewed  CBC - Abnormal; Notable for the following components:      Result Value   Hemoglobin 15.2 (*)    All other components  within normal limits  COMPREHENSIVE METABOLIC PANEL WITH GFR    EKG   Radiology No results found.  Procedures Procedures (including critical care time)  Medications Ordered in UC Medications  sodium chloride  0.9 % bolus 500 mL (0 mLs Intravenous Stopped 09/11/23 1528)    Initial Impression / Assessment and Plan / UC Course  I have reviewed the triage vital signs and the nursing notes.  Pertinent labs & imaging results that were available during my care of the patient were reviewed by me and considered in my medical decision making (see chart for details).     Diarrhea, unspecified type  Finding of above normal blood pressure   Diarrhea for a month but ability to continue with by mouth intake.  Symptoms could be secondary to some type of irritable bowel, diet, inflammation but less likely is infectious given the duration of time.  We have ordered lab work today as well as given IV fluids.  We will prescribe medication to help with diarrhea as well.  Strongly recommend scheduling appointment with gastroenterology as well as primary care.  We have done the following today: Complete metabolic panel and complete blood count blood work done today.  These results may take 24 to 48 hours to finalize.  These results will be on your MyChart.  If there are any abnormalities we will contact you 500 cc of IV fluids given today to help with hydration Diphenoxylate -atropine  (lomotil ) 1 tablet 4 times daily as needed for diarrhea/loose stools.  Strongly recommend scheduling an appointment with gastroenterology Amlodipine  5 mg daily. This medication is for high blood pressure.  If you develop side effects that are not tolerable then discontinue the medication.  Make sure to monitor your blood pressure at home Keep appointment with primary care provider as scheduled Schedule an appointment with your rheumatologist to address concerns about lupus. If symptoms worsen significantly then recommend  going to the emergency room for further evaluation  Final Clinical Impressions(s) / UC Diagnoses   Final diagnoses:  Diarrhea, unspecified type  Finding of above normal blood pressure     Discharge Instructions      Diarrhea for a month but ability to continue with by mouth intake.  Symptoms could be secondary to some type of irritable bowel, diet, inflammation but less likely is infectious given the duration of time.  We have ordered lab work today as well as given IV fluids.  We will prescribe medication to help with diarrhea as well.  Strongly recommend scheduling appointment with gastroenterology as well as primary care.  We have done the following today: Complete metabolic panel and complete blood count blood work done today.  These results may take 24 to 48 hours to finalize.  These results will be on your MyChart.  If there are any abnormalities we will contact you 500 cc of IV fluids given today to help with hydration Diphenoxylate -atropine  (lomotil ) 1 tablet 4 times daily as needed for diarrhea/loose stools.  Strongly recommend scheduling an appointment with gastroenterology Amlodipine  5 mg daily. This medication is for high blood pressure.  If you develop side effects that are not tolerable then discontinue the medication.  Make sure to monitor your blood pressure at home Keep appointment with primary care provider as scheduled Schedule an appointment with your rheumatologist to address concerns about lupus. If symptoms worsen significantly then recommend going to the emergency room for further evaluation     ED Prescriptions     Medication Sig Dispense Auth. Provider   diphenoxylate -atropine  (LOMOTIL ) 2.5-0.025 MG tablet Take 1 tablet by mouth 4 (four) times daily as needed for diarrhea or loose stools. 30 tablet Shiv Shuey A, PA-C   amLODipine  (NORVASC ) 5 MG tablet Take 1 tablet (5 mg total) by mouth daily. 30 tablet Teresa Almarie LABOR, [place]      I have reviewed  the PDMP during this encounter.   Teresa Almarie LABOR, PA-C 09/11/23 1537

## 2023-09-11 NOTE — Discharge Instructions (Addendum)
 Diarrhea for a month but ability to continue with by mouth intake.  Symptoms could be secondary to some type of irritable bowel, diet, inflammation but less likely is infectious given the duration of time.  We have ordered lab work today as well as given IV fluids.  We will prescribe medication to help with diarrhea as well.  Strongly recommend scheduling appointment with gastroenterology as well as primary care.  We have done the following today: Complete metabolic panel and complete blood count blood work done today.  These results may take 24 to 48 hours to finalize.  These results will be on your MyChart.  If there are any abnormalities we will contact you 500 cc of IV fluids given today to help with hydration Diphenoxylate -atropine  (lomotil ) 1 tablet 4 times daily as needed for diarrhea/loose stools.  Strongly recommend scheduling an appointment with gastroenterology Amlodipine  5 mg daily. This medication is for high blood pressure.  If you develop side effects that are not tolerable then discontinue the medication.  Make sure to monitor your blood pressure at home Keep appointment with primary care provider as scheduled Schedule an appointment with your rheumatologist to address concerns about lupus. If symptoms worsen significantly then recommend going to the emergency room for further evaluation

## 2023-09-11 NOTE — Progress Notes (Unsigned)
 [facility]  [address]  [place] [address]  Dept: [phone]  Dept Fax: [phone]     Patient ID: Sierra Nichols is a 48 y.o. female who presents for Weight Management (ZEPBOUND  2.5MG  F/U).    Subjective  HPI  Zepbound  f/u--paying OOP directly to [employer]. 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 Noticing medication helping with mind-hunger and portion control.  Exercise: pt recently bought wrist and ankle weights for walking, has exercise equipment at house to use.    Being followed by our [facility]     Objective  Visit Vitals  BP 126/68   Pulse 74   Temp 37 C (98.6 F) (Oral)   Resp 16   Ht 1.651 m   Wt 113.4 kg Comment: ACTUAL   SpO2 98%   BMI 41.59 kg/m   OB Status Implant   BSA 2.28 m     Physical Exam  Pt appears comfortable and in no acute distress. Respirations regular and unlabored.   Physical Exam  Vitals reviewed.   Constitutional:       Appearance: Normal appearance.   Pulmonary:      Effort: Pulmonary effort is normal.   Skin:     General: Skin is warm.   Neurological:      General: No focal deficit present.      Mental Status: Oriented to person, place, and time. Mental status is at baseline.   Psychiatric:         Mood and Affect: Mood normal.         Behavior: Behavior normal.         Thought Content: Thought content normal.         Judgment: Judgment normal  Assessment / Plan  1. 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   Increase to 5 mg. I will prescribe patient the 10 mg vial that they will dose half for 5mg  weekly (cost $250 monthly this way). Pt instructed to keep environment clean; alcohol wipe vial before use, do not  reuse needles. They will remain at 5mg  weekly at this time.  I will see the patient back in ~4 months. (S)he will call with any issues or concerns     - tirzepatide , weight loss, (Zepbound ) 10 mg/0.5 mL solution; Inject 10 mg under the skin every 7 (seven) days.  Dispense: 2 mL; Refill: 2    2. Dietary counseling  Reviewed with patient having 4-6 small meals daily instead of 2-3 large meals d/t medication slowing down your digestive track. Fatty and fried food will also be harder to tolerate because of this and should be limited. Chew food thoroughly about 25 chews per 1 bite.   Even if you are not hungry you still need to sit down and have a few bites to eat as food is fuel. Medication is meant to be used to eat a smaller amt of a well balanced diet.  Consume >64ozs of water and 60gms of protein daily   Have more soluable fiber vs insoluable   Eat more organic vs not organic  Have a high protein based snack before bed such as 2 tablespoons of PB->this could also help with sxs of nausea and fatigue the next day     - tirzepatide , weight loss, (Zepbound ) 10 mg/0.5 mL solution; Inject 10 mg under the skin every 7 (seven) days.  Dispense: 2 mL; Refill: 2    3. Exercise counseling  Goal weekly of moderate intensity exercise, twice weekly of resistance training to prevent lean muscle mass loss   Encouraged to get and use smart scale to track fat% and muscle mass     - tirzepatide , weight loss, (Zepbound ) 10 mg/0.5 mL solution; Inject 10 mg under the skin every 7 (seven) days.  Dispense: 2 mL; Refill: 2

## 2023-09-25 ENCOUNTER — Ambulatory Visit: Payer: PRIVATE HEALTH INSURANCE | Attending: Psychologist | Primary: Family Medicine

## 2023-10-02 MED ORDER — tirzepatide, weight loss, (Zepbound) 10 mg/0.5 mL solution
10 | SUBCUTANEOUS | 1 refills | 28.00000 days | Status: AC
Start: 2023-10-02 — End: 2023-11-27

## 2023-10-09 ENCOUNTER — Ambulatory Visit: Admitting: Family Medicine

## 2023-11-01 ENCOUNTER — Ambulatory Visit: Payer: PRIVATE HEALTH INSURANCE | Primary: Family Medicine

## 2023-12-06 NOTE — Telephone Encounter (Signed)
 Patient had an appointment 11/17 @ 7AM with NP Leita that has been cancelled and needs to be rescheduled due to provider being out of office. Left voice message for patient to reschedule appointment and obtain prescription refill and/or the next dosage for weight loss medication. Awaiting call back.

## 2024-01-08 ENCOUNTER — Ambulatory Visit: Payer: PRIVATE HEALTH INSURANCE | Attending: Adult Health | Primary: Family Medicine

## 2024-01-11 ENCOUNTER — Ambulatory Visit
Admit: 2024-01-11 | Discharge: 2024-01-11 | Payer: PRIVATE HEALTH INSURANCE | Attending: Nurse Practitioner | Primary: Family Medicine

## 2024-01-11 DIAGNOSIS — E66812 Obesity, class 2: Principal | ICD-10-CM

## 2024-01-11 MED ORDER — TIRZEPATIDE (WEIGHT LOSS) 10 MG/0.5 ML SUBCUTANEOUS PEN INJECTOR
10 | SUBCUTANEOUS | 2 refills | 28.00000 days | Status: DC
Start: 2024-01-11 — End: 2024-01-12

## 2024-01-11 NOTE — Progress Notes (Signed)
 Comorbidity List      Present post op(Yes/No) Medication (Yes/No) Resolved post op (Yes/No) Comments    Sleep apnea NO      GERD YES YES NO    Hypertension        Hyperlipidemia       Diabetes        Patient Reports No Co-Morbidities            Sleep Apnea Treatment     Yes  No  Comments    CPAP/BiPAP/Other Recommended   X    CPAP/BiPAP/Other Used       Surgical Correction done              Yes  No  Comments /Locations    Was patient readmitted to the hospital since the last follow up?   X    Post-Bariatric Surgical Operations or procedures performed since last visit ?  X    Has patient been seen at an ED or Urgent Care since last follow up ?   X

## 2024-01-11 NOTE — Progress Notes (Signed)
 [facility]  [address]  [place] [address]  Dept: [phone]  Dept Fax: [phone]     Patient ID: Sierra Nichols is a 48 y.o. female who presents for Weight Management (SLEEVE GASTRECTOMY 2019/4 MONTH MEDICAL F/U ZEPBOUND ).    Subjective  HPI  Sierra Nichols is a 48 y.o. female presenting s/p sleeve (2019) with Dr. Debora with obesity, for follow up of medical weight loss      Has been taking zepbound  10 mg, she is splitting dose and taking 5 mg through [facility]  weight before starting zepbound  was 257 lbs BMI 42, June 2025  weight has gone from 250 lbs to 222 lbs the past 4 months   Denies side effects  Hunger and food noise are better  She is eating between 60 -90 grams protein daily  She drinks around 80 ounces fluids a day  No exercise  She would like to continue same dose this month    Objective  Visit Vitals  BP 105/70 (BP Location: Right arm, Patient Position: Sitting, BP Cuff Size: Large adult long)   Pulse 71   Temp 36 C (96.8 F) (Oral)   Resp 19   Ht 1.651 m   Wt 101 kg Comment: ACTUAL   SpO2 98%   BMI 37.04 kg/m   OB Status Implant   BSA 2.15 m     Physical Exam  Vitals reviewed. Pt appears comfortable and in no acute distress. Respirations regular and unlabored  Constitutional:       Appearance: Normal appearance.   Pulmonary:      Effort: Pulmonary effort is normal.  Neurological:      General: No focal deficit present.      Mental Status: oriented to person, place, and time. Mental status is at baseline.   Psychiatric:         Mood and Affect: Mood normal.         Behavior: Behavior normal.         Thought Content: Thought content normal.         Judgment: Judgment normal.     Assessment / Plan  1. Obesity, Class II, BMI 35-39.9 (Primary)  - Pt educated that medical wt loss is only a weight loss tool, still need to eat healthy and remain physically active.    - continue same dose of 5 mg, divided dose of 10 mg zepbound  through [facility]  - Pt is aware of  what side effects to monitor for   - f/u with NP in 5 months    - tirzepatide , weight loss, (Zepbound ) 10 mg/0.5 mL syringe; Inject 0.5 mL (10 mg) under the skin every 7 (seven) days.  Dispense: 2 mL; Refill: 2    2. Nutritional counseling  - Reviewed with patient having 4-6 small meals daily instead of 2-3 large meals d/t medication slowing down your digestive track, eat foods slowly.  Fatty, greasy, fried food, foods high in sugar, saturated fats, and processed carbohydrates will also be harder to tolerate because of this and should be limited.   - encouraged to reach minimum 60 grams protein daily  - encouraged to reach minimum 64 ounces fluids daily  - Even if you are not hungry you still need to sit down and have a few bites to eat as food is fuel. Medication is meant to be used to eat a smaller amt of a well balanced diet.  - Encouraged to make healthier dietary  choices; limiting carbs and sweets  - Have more soluable fiber vs insoluable     - tirzepatide , weight loss, (Zepbound ) 10 mg/0.5 mL syringe; Inject 0.5 mL (10 mg) under the skin every 7 (seven) days.  Dispense: 2 mL; Refill: 2    3. Exercise counseling  - goal 150 minutes of cardio per week (30 minutes five times a week) and twice a week 20 minutes muscle strengthening exercising     - tirzepatide , weight loss, (Zepbound ) 10 mg/0.5 mL syringe; Inject 0.5 mL (10 mg) under the skin every 7 (seven) days.  Dispense: 2 mL; Refill: 2

## 2024-01-12 MED ORDER — TIRZEPATIDE (WEIGHT LOSS) 10 MG/0.5 ML SUBCUTANEOUS SOLUTION
10 | SUBCUTANEOUS | 2 refills | 28.00000 days | Status: DC
Start: 2024-01-12 — End: 2024-02-21

## 2024-01-12 NOTE — Telephone Encounter (Signed)
 Resend zepbound  10 mg to [facility]
